# Patient Record
Sex: Female | Born: 1950 | Race: White | Hispanic: No | Marital: Married | Smoking: Never smoker
Health system: Southern US, Community
[De-identification: ages and names within clinical notes are randomized; demographics above are authoritative.]

## PROBLEM LIST (undated history)

## (undated) DIAGNOSIS — R011 Cardiac murmur, unspecified: Secondary | ICD-10-CM

## (undated) DIAGNOSIS — R51 Headache: Secondary | ICD-10-CM

## (undated) DIAGNOSIS — T4145XA Adverse effect of unspecified anesthetic, initial encounter: Secondary | ICD-10-CM

## (undated) DIAGNOSIS — R519 Headache, unspecified: Secondary | ICD-10-CM

## (undated) DIAGNOSIS — T8859XA Other complications of anesthesia, initial encounter: Secondary | ICD-10-CM

## (undated) HISTORY — PX: CHOLECYSTECTOMY: SHX55

## (undated) HISTORY — PX: ABDOMINAL HYSTERECTOMY: SHX81

---

## 2017-08-17 DIAGNOSIS — M542 Cervicalgia: Secondary | ICD-10-CM | POA: Diagnosis not present

## 2017-08-17 DIAGNOSIS — M25551 Pain in right hip: Secondary | ICD-10-CM | POA: Diagnosis not present

## 2017-08-17 DIAGNOSIS — M9903 Segmental and somatic dysfunction of lumbar region: Secondary | ICD-10-CM | POA: Diagnosis not present

## 2017-08-17 DIAGNOSIS — M9902 Segmental and somatic dysfunction of thoracic region: Secondary | ICD-10-CM | POA: Diagnosis not present

## 2017-08-17 DIAGNOSIS — M5431 Sciatica, right side: Secondary | ICD-10-CM | POA: Diagnosis not present

## 2017-08-17 DIAGNOSIS — M9901 Segmental and somatic dysfunction of cervical region: Secondary | ICD-10-CM | POA: Diagnosis not present

## 2017-08-20 DIAGNOSIS — M542 Cervicalgia: Secondary | ICD-10-CM | POA: Diagnosis not present

## 2017-08-20 DIAGNOSIS — M5431 Sciatica, right side: Secondary | ICD-10-CM | POA: Diagnosis not present

## 2017-08-20 DIAGNOSIS — M9903 Segmental and somatic dysfunction of lumbar region: Secondary | ICD-10-CM | POA: Diagnosis not present

## 2017-08-20 DIAGNOSIS — M25551 Pain in right hip: Secondary | ICD-10-CM | POA: Diagnosis not present

## 2017-08-20 DIAGNOSIS — M9902 Segmental and somatic dysfunction of thoracic region: Secondary | ICD-10-CM | POA: Diagnosis not present

## 2017-08-20 DIAGNOSIS — M9901 Segmental and somatic dysfunction of cervical region: Secondary | ICD-10-CM | POA: Diagnosis not present

## 2017-08-24 DIAGNOSIS — M542 Cervicalgia: Secondary | ICD-10-CM | POA: Diagnosis not present

## 2017-08-24 DIAGNOSIS — M25551 Pain in right hip: Secondary | ICD-10-CM | POA: Diagnosis not present

## 2017-08-24 DIAGNOSIS — M9903 Segmental and somatic dysfunction of lumbar region: Secondary | ICD-10-CM | POA: Diagnosis not present

## 2017-08-24 DIAGNOSIS — M9902 Segmental and somatic dysfunction of thoracic region: Secondary | ICD-10-CM | POA: Diagnosis not present

## 2017-08-24 DIAGNOSIS — M5431 Sciatica, right side: Secondary | ICD-10-CM | POA: Diagnosis not present

## 2017-08-24 DIAGNOSIS — M9901 Segmental and somatic dysfunction of cervical region: Secondary | ICD-10-CM | POA: Diagnosis not present

## 2017-08-27 DIAGNOSIS — M9902 Segmental and somatic dysfunction of thoracic region: Secondary | ICD-10-CM | POA: Diagnosis not present

## 2017-08-27 DIAGNOSIS — M5431 Sciatica, right side: Secondary | ICD-10-CM | POA: Diagnosis not present

## 2017-08-27 DIAGNOSIS — M25551 Pain in right hip: Secondary | ICD-10-CM | POA: Diagnosis not present

## 2017-08-27 DIAGNOSIS — M9903 Segmental and somatic dysfunction of lumbar region: Secondary | ICD-10-CM | POA: Diagnosis not present

## 2017-08-27 DIAGNOSIS — M9901 Segmental and somatic dysfunction of cervical region: Secondary | ICD-10-CM | POA: Diagnosis not present

## 2017-08-27 DIAGNOSIS — M542 Cervicalgia: Secondary | ICD-10-CM | POA: Diagnosis not present

## 2017-08-31 DIAGNOSIS — M9902 Segmental and somatic dysfunction of thoracic region: Secondary | ICD-10-CM | POA: Diagnosis not present

## 2017-08-31 DIAGNOSIS — M25551 Pain in right hip: Secondary | ICD-10-CM | POA: Diagnosis not present

## 2017-08-31 DIAGNOSIS — M9903 Segmental and somatic dysfunction of lumbar region: Secondary | ICD-10-CM | POA: Diagnosis not present

## 2017-08-31 DIAGNOSIS — M9901 Segmental and somatic dysfunction of cervical region: Secondary | ICD-10-CM | POA: Diagnosis not present

## 2017-08-31 DIAGNOSIS — M542 Cervicalgia: Secondary | ICD-10-CM | POA: Diagnosis not present

## 2017-08-31 DIAGNOSIS — M5431 Sciatica, right side: Secondary | ICD-10-CM | POA: Diagnosis not present

## 2017-09-03 DIAGNOSIS — M9903 Segmental and somatic dysfunction of lumbar region: Secondary | ICD-10-CM | POA: Diagnosis not present

## 2017-09-03 DIAGNOSIS — M25551 Pain in right hip: Secondary | ICD-10-CM | POA: Diagnosis not present

## 2017-09-03 DIAGNOSIS — M5431 Sciatica, right side: Secondary | ICD-10-CM | POA: Diagnosis not present

## 2017-09-03 DIAGNOSIS — M9902 Segmental and somatic dysfunction of thoracic region: Secondary | ICD-10-CM | POA: Diagnosis not present

## 2017-09-03 DIAGNOSIS — M542 Cervicalgia: Secondary | ICD-10-CM | POA: Diagnosis not present

## 2017-09-03 DIAGNOSIS — M9901 Segmental and somatic dysfunction of cervical region: Secondary | ICD-10-CM | POA: Diagnosis not present

## 2017-09-07 DIAGNOSIS — M9902 Segmental and somatic dysfunction of thoracic region: Secondary | ICD-10-CM | POA: Diagnosis not present

## 2017-09-07 DIAGNOSIS — M542 Cervicalgia: Secondary | ICD-10-CM | POA: Diagnosis not present

## 2017-09-07 DIAGNOSIS — M9903 Segmental and somatic dysfunction of lumbar region: Secondary | ICD-10-CM | POA: Diagnosis not present

## 2017-09-07 DIAGNOSIS — M5431 Sciatica, right side: Secondary | ICD-10-CM | POA: Diagnosis not present

## 2017-09-07 DIAGNOSIS — M9901 Segmental and somatic dysfunction of cervical region: Secondary | ICD-10-CM | POA: Diagnosis not present

## 2017-09-07 DIAGNOSIS — M25551 Pain in right hip: Secondary | ICD-10-CM | POA: Diagnosis not present

## 2017-09-14 DIAGNOSIS — M542 Cervicalgia: Secondary | ICD-10-CM | POA: Diagnosis not present

## 2017-09-14 DIAGNOSIS — M25551 Pain in right hip: Secondary | ICD-10-CM | POA: Diagnosis not present

## 2017-09-14 DIAGNOSIS — M9903 Segmental and somatic dysfunction of lumbar region: Secondary | ICD-10-CM | POA: Diagnosis not present

## 2017-09-14 DIAGNOSIS — M9902 Segmental and somatic dysfunction of thoracic region: Secondary | ICD-10-CM | POA: Diagnosis not present

## 2017-09-14 DIAGNOSIS — M5431 Sciatica, right side: Secondary | ICD-10-CM | POA: Diagnosis not present

## 2017-09-14 DIAGNOSIS — M9901 Segmental and somatic dysfunction of cervical region: Secondary | ICD-10-CM | POA: Diagnosis not present

## 2017-09-21 DIAGNOSIS — M542 Cervicalgia: Secondary | ICD-10-CM | POA: Diagnosis not present

## 2017-09-21 DIAGNOSIS — M9902 Segmental and somatic dysfunction of thoracic region: Secondary | ICD-10-CM | POA: Diagnosis not present

## 2017-09-21 DIAGNOSIS — M9903 Segmental and somatic dysfunction of lumbar region: Secondary | ICD-10-CM | POA: Diagnosis not present

## 2017-09-21 DIAGNOSIS — M5431 Sciatica, right side: Secondary | ICD-10-CM | POA: Diagnosis not present

## 2017-09-21 DIAGNOSIS — M25551 Pain in right hip: Secondary | ICD-10-CM | POA: Diagnosis not present

## 2017-09-21 DIAGNOSIS — M9901 Segmental and somatic dysfunction of cervical region: Secondary | ICD-10-CM | POA: Diagnosis not present

## 2017-10-01 DIAGNOSIS — M5431 Sciatica, right side: Secondary | ICD-10-CM | POA: Diagnosis not present

## 2017-10-01 DIAGNOSIS — M9902 Segmental and somatic dysfunction of thoracic region: Secondary | ICD-10-CM | POA: Diagnosis not present

## 2017-10-01 DIAGNOSIS — M25551 Pain in right hip: Secondary | ICD-10-CM | POA: Diagnosis not present

## 2017-10-01 DIAGNOSIS — M542 Cervicalgia: Secondary | ICD-10-CM | POA: Diagnosis not present

## 2017-10-01 DIAGNOSIS — M9901 Segmental and somatic dysfunction of cervical region: Secondary | ICD-10-CM | POA: Diagnosis not present

## 2017-10-01 DIAGNOSIS — M9903 Segmental and somatic dysfunction of lumbar region: Secondary | ICD-10-CM | POA: Diagnosis not present

## 2017-10-15 DIAGNOSIS — M9902 Segmental and somatic dysfunction of thoracic region: Secondary | ICD-10-CM | POA: Diagnosis not present

## 2017-10-15 DIAGNOSIS — M9903 Segmental and somatic dysfunction of lumbar region: Secondary | ICD-10-CM | POA: Diagnosis not present

## 2017-10-15 DIAGNOSIS — M542 Cervicalgia: Secondary | ICD-10-CM | POA: Diagnosis not present

## 2017-10-15 DIAGNOSIS — M9901 Segmental and somatic dysfunction of cervical region: Secondary | ICD-10-CM | POA: Diagnosis not present

## 2017-10-15 DIAGNOSIS — M25551 Pain in right hip: Secondary | ICD-10-CM | POA: Diagnosis not present

## 2017-10-15 DIAGNOSIS — M5431 Sciatica, right side: Secondary | ICD-10-CM | POA: Diagnosis not present

## 2017-10-29 DIAGNOSIS — M5431 Sciatica, right side: Secondary | ICD-10-CM | POA: Diagnosis not present

## 2017-10-29 DIAGNOSIS — M9901 Segmental and somatic dysfunction of cervical region: Secondary | ICD-10-CM | POA: Diagnosis not present

## 2017-10-29 DIAGNOSIS — M25551 Pain in right hip: Secondary | ICD-10-CM | POA: Diagnosis not present

## 2017-10-29 DIAGNOSIS — M9903 Segmental and somatic dysfunction of lumbar region: Secondary | ICD-10-CM | POA: Diagnosis not present

## 2017-10-29 DIAGNOSIS — M542 Cervicalgia: Secondary | ICD-10-CM | POA: Diagnosis not present

## 2017-10-29 DIAGNOSIS — M9902 Segmental and somatic dysfunction of thoracic region: Secondary | ICD-10-CM | POA: Diagnosis not present

## 2017-11-12 DIAGNOSIS — M9903 Segmental and somatic dysfunction of lumbar region: Secondary | ICD-10-CM | POA: Diagnosis not present

## 2017-11-12 DIAGNOSIS — M9901 Segmental and somatic dysfunction of cervical region: Secondary | ICD-10-CM | POA: Diagnosis not present

## 2017-11-12 DIAGNOSIS — M25551 Pain in right hip: Secondary | ICD-10-CM | POA: Diagnosis not present

## 2017-11-12 DIAGNOSIS — M9902 Segmental and somatic dysfunction of thoracic region: Secondary | ICD-10-CM | POA: Diagnosis not present

## 2017-11-12 DIAGNOSIS — M5431 Sciatica, right side: Secondary | ICD-10-CM | POA: Diagnosis not present

## 2017-11-12 DIAGNOSIS — M542 Cervicalgia: Secondary | ICD-10-CM | POA: Diagnosis not present

## 2017-11-24 DIAGNOSIS — I10 Essential (primary) hypertension: Secondary | ICD-10-CM | POA: Diagnosis not present

## 2017-11-24 DIAGNOSIS — E782 Mixed hyperlipidemia: Secondary | ICD-10-CM | POA: Diagnosis not present

## 2017-11-25 ENCOUNTER — Encounter (INDEPENDENT_AMBULATORY_CARE_PROVIDER_SITE_OTHER): Payer: Self-pay | Admitting: *Deleted

## 2017-11-26 DIAGNOSIS — M25551 Pain in right hip: Secondary | ICD-10-CM | POA: Diagnosis not present

## 2017-11-26 DIAGNOSIS — M9901 Segmental and somatic dysfunction of cervical region: Secondary | ICD-10-CM | POA: Diagnosis not present

## 2017-11-26 DIAGNOSIS — M9902 Segmental and somatic dysfunction of thoracic region: Secondary | ICD-10-CM | POA: Diagnosis not present

## 2017-11-26 DIAGNOSIS — M542 Cervicalgia: Secondary | ICD-10-CM | POA: Diagnosis not present

## 2017-11-26 DIAGNOSIS — M5431 Sciatica, right side: Secondary | ICD-10-CM | POA: Diagnosis not present

## 2017-11-26 DIAGNOSIS — M9903 Segmental and somatic dysfunction of lumbar region: Secondary | ICD-10-CM | POA: Diagnosis not present

## 2017-12-03 DIAGNOSIS — M542 Cervicalgia: Secondary | ICD-10-CM | POA: Diagnosis not present

## 2017-12-03 DIAGNOSIS — M5431 Sciatica, right side: Secondary | ICD-10-CM | POA: Diagnosis not present

## 2017-12-03 DIAGNOSIS — M9903 Segmental and somatic dysfunction of lumbar region: Secondary | ICD-10-CM | POA: Diagnosis not present

## 2017-12-03 DIAGNOSIS — M9901 Segmental and somatic dysfunction of cervical region: Secondary | ICD-10-CM | POA: Diagnosis not present

## 2017-12-03 DIAGNOSIS — M9902 Segmental and somatic dysfunction of thoracic region: Secondary | ICD-10-CM | POA: Diagnosis not present

## 2017-12-03 DIAGNOSIS — M25551 Pain in right hip: Secondary | ICD-10-CM | POA: Diagnosis not present

## 2017-12-10 ENCOUNTER — Encounter (INDEPENDENT_AMBULATORY_CARE_PROVIDER_SITE_OTHER): Payer: Self-pay | Admitting: *Deleted

## 2017-12-10 DIAGNOSIS — M9902 Segmental and somatic dysfunction of thoracic region: Secondary | ICD-10-CM | POA: Diagnosis not present

## 2017-12-10 DIAGNOSIS — Z1231 Encounter for screening mammogram for malignant neoplasm of breast: Secondary | ICD-10-CM | POA: Diagnosis not present

## 2017-12-10 DIAGNOSIS — M9901 Segmental and somatic dysfunction of cervical region: Secondary | ICD-10-CM | POA: Diagnosis not present

## 2017-12-10 DIAGNOSIS — M25551 Pain in right hip: Secondary | ICD-10-CM | POA: Diagnosis not present

## 2017-12-10 DIAGNOSIS — M542 Cervicalgia: Secondary | ICD-10-CM | POA: Diagnosis not present

## 2017-12-10 DIAGNOSIS — M9903 Segmental and somatic dysfunction of lumbar region: Secondary | ICD-10-CM | POA: Diagnosis not present

## 2017-12-10 DIAGNOSIS — M5431 Sciatica, right side: Secondary | ICD-10-CM | POA: Diagnosis not present

## 2017-12-17 ENCOUNTER — Other Ambulatory Visit (INDEPENDENT_AMBULATORY_CARE_PROVIDER_SITE_OTHER): Payer: Self-pay | Admitting: *Deleted

## 2017-12-17 DIAGNOSIS — M542 Cervicalgia: Secondary | ICD-10-CM | POA: Diagnosis not present

## 2017-12-17 DIAGNOSIS — Z1211 Encounter for screening for malignant neoplasm of colon: Secondary | ICD-10-CM

## 2017-12-17 DIAGNOSIS — M25551 Pain in right hip: Secondary | ICD-10-CM | POA: Diagnosis not present

## 2017-12-17 DIAGNOSIS — M5431 Sciatica, right side: Secondary | ICD-10-CM | POA: Diagnosis not present

## 2017-12-17 DIAGNOSIS — M9901 Segmental and somatic dysfunction of cervical region: Secondary | ICD-10-CM | POA: Diagnosis not present

## 2017-12-17 DIAGNOSIS — M9902 Segmental and somatic dysfunction of thoracic region: Secondary | ICD-10-CM | POA: Diagnosis not present

## 2017-12-17 DIAGNOSIS — M9903 Segmental and somatic dysfunction of lumbar region: Secondary | ICD-10-CM | POA: Diagnosis not present

## 2017-12-22 DIAGNOSIS — M810 Age-related osteoporosis without current pathological fracture: Secondary | ICD-10-CM | POA: Diagnosis not present

## 2017-12-22 DIAGNOSIS — M818 Other osteoporosis without current pathological fracture: Secondary | ICD-10-CM | POA: Diagnosis not present

## 2017-12-31 DIAGNOSIS — M25551 Pain in right hip: Secondary | ICD-10-CM | POA: Diagnosis not present

## 2017-12-31 DIAGNOSIS — M5431 Sciatica, right side: Secondary | ICD-10-CM | POA: Diagnosis not present

## 2017-12-31 DIAGNOSIS — M9902 Segmental and somatic dysfunction of thoracic region: Secondary | ICD-10-CM | POA: Diagnosis not present

## 2017-12-31 DIAGNOSIS — M9901 Segmental and somatic dysfunction of cervical region: Secondary | ICD-10-CM | POA: Diagnosis not present

## 2017-12-31 DIAGNOSIS — M9903 Segmental and somatic dysfunction of lumbar region: Secondary | ICD-10-CM | POA: Diagnosis not present

## 2017-12-31 DIAGNOSIS — M542 Cervicalgia: Secondary | ICD-10-CM | POA: Diagnosis not present

## 2018-01-07 DIAGNOSIS — M25551 Pain in right hip: Secondary | ICD-10-CM | POA: Diagnosis not present

## 2018-01-07 DIAGNOSIS — M9903 Segmental and somatic dysfunction of lumbar region: Secondary | ICD-10-CM | POA: Diagnosis not present

## 2018-01-07 DIAGNOSIS — M9902 Segmental and somatic dysfunction of thoracic region: Secondary | ICD-10-CM | POA: Diagnosis not present

## 2018-01-07 DIAGNOSIS — M9901 Segmental and somatic dysfunction of cervical region: Secondary | ICD-10-CM | POA: Diagnosis not present

## 2018-01-07 DIAGNOSIS — M5431 Sciatica, right side: Secondary | ICD-10-CM | POA: Diagnosis not present

## 2018-01-07 DIAGNOSIS — M542 Cervicalgia: Secondary | ICD-10-CM | POA: Diagnosis not present

## 2018-01-14 DIAGNOSIS — M542 Cervicalgia: Secondary | ICD-10-CM | POA: Diagnosis not present

## 2018-01-14 DIAGNOSIS — M9901 Segmental and somatic dysfunction of cervical region: Secondary | ICD-10-CM | POA: Diagnosis not present

## 2018-01-14 DIAGNOSIS — M25551 Pain in right hip: Secondary | ICD-10-CM | POA: Diagnosis not present

## 2018-01-14 DIAGNOSIS — M5431 Sciatica, right side: Secondary | ICD-10-CM | POA: Diagnosis not present

## 2018-01-14 DIAGNOSIS — M9903 Segmental and somatic dysfunction of lumbar region: Secondary | ICD-10-CM | POA: Diagnosis not present

## 2018-01-14 DIAGNOSIS — M9902 Segmental and somatic dysfunction of thoracic region: Secondary | ICD-10-CM | POA: Diagnosis not present

## 2018-01-28 DIAGNOSIS — M5431 Sciatica, right side: Secondary | ICD-10-CM | POA: Diagnosis not present

## 2018-01-28 DIAGNOSIS — M9902 Segmental and somatic dysfunction of thoracic region: Secondary | ICD-10-CM | POA: Diagnosis not present

## 2018-01-28 DIAGNOSIS — M9901 Segmental and somatic dysfunction of cervical region: Secondary | ICD-10-CM | POA: Diagnosis not present

## 2018-01-28 DIAGNOSIS — M25551 Pain in right hip: Secondary | ICD-10-CM | POA: Diagnosis not present

## 2018-01-28 DIAGNOSIS — M9903 Segmental and somatic dysfunction of lumbar region: Secondary | ICD-10-CM | POA: Diagnosis not present

## 2018-01-28 DIAGNOSIS — M542 Cervicalgia: Secondary | ICD-10-CM | POA: Diagnosis not present

## 2018-02-04 DIAGNOSIS — M9901 Segmental and somatic dysfunction of cervical region: Secondary | ICD-10-CM | POA: Diagnosis not present

## 2018-02-04 DIAGNOSIS — M542 Cervicalgia: Secondary | ICD-10-CM | POA: Diagnosis not present

## 2018-02-04 DIAGNOSIS — M25551 Pain in right hip: Secondary | ICD-10-CM | POA: Diagnosis not present

## 2018-02-04 DIAGNOSIS — M9903 Segmental and somatic dysfunction of lumbar region: Secondary | ICD-10-CM | POA: Diagnosis not present

## 2018-02-04 DIAGNOSIS — M5431 Sciatica, right side: Secondary | ICD-10-CM | POA: Diagnosis not present

## 2018-02-04 DIAGNOSIS — M9902 Segmental and somatic dysfunction of thoracic region: Secondary | ICD-10-CM | POA: Diagnosis not present

## 2018-02-18 DIAGNOSIS — M542 Cervicalgia: Secondary | ICD-10-CM | POA: Diagnosis not present

## 2018-02-18 DIAGNOSIS — M5431 Sciatica, right side: Secondary | ICD-10-CM | POA: Diagnosis not present

## 2018-02-18 DIAGNOSIS — M9903 Segmental and somatic dysfunction of lumbar region: Secondary | ICD-10-CM | POA: Diagnosis not present

## 2018-02-18 DIAGNOSIS — M9902 Segmental and somatic dysfunction of thoracic region: Secondary | ICD-10-CM | POA: Diagnosis not present

## 2018-02-18 DIAGNOSIS — M9901 Segmental and somatic dysfunction of cervical region: Secondary | ICD-10-CM | POA: Diagnosis not present

## 2018-02-18 DIAGNOSIS — M25551 Pain in right hip: Secondary | ICD-10-CM | POA: Diagnosis not present

## 2018-03-01 ENCOUNTER — Encounter (INDEPENDENT_AMBULATORY_CARE_PROVIDER_SITE_OTHER): Payer: Self-pay | Admitting: *Deleted

## 2018-03-01 ENCOUNTER — Telehealth (INDEPENDENT_AMBULATORY_CARE_PROVIDER_SITE_OTHER): Payer: Self-pay | Admitting: *Deleted

## 2018-03-01 NOTE — Telephone Encounter (Signed)
Patient needs suprep 

## 2018-03-04 DIAGNOSIS — M542 Cervicalgia: Secondary | ICD-10-CM | POA: Diagnosis not present

## 2018-03-04 DIAGNOSIS — M25551 Pain in right hip: Secondary | ICD-10-CM | POA: Diagnosis not present

## 2018-03-04 DIAGNOSIS — M9902 Segmental and somatic dysfunction of thoracic region: Secondary | ICD-10-CM | POA: Diagnosis not present

## 2018-03-04 DIAGNOSIS — M5431 Sciatica, right side: Secondary | ICD-10-CM | POA: Diagnosis not present

## 2018-03-04 DIAGNOSIS — M9901 Segmental and somatic dysfunction of cervical region: Secondary | ICD-10-CM | POA: Diagnosis not present

## 2018-03-04 DIAGNOSIS — M9903 Segmental and somatic dysfunction of lumbar region: Secondary | ICD-10-CM | POA: Diagnosis not present

## 2018-03-04 MED ORDER — SUPREP BOWEL PREP KIT 17.5-3.13-1.6 GM/177ML PO SOLN: 1.0000 | Freq: Once | ORAL | 0 refills | Status: AC

## 2018-03-11 DIAGNOSIS — M9902 Segmental and somatic dysfunction of thoracic region: Secondary | ICD-10-CM | POA: Diagnosis not present

## 2018-03-11 DIAGNOSIS — M9901 Segmental and somatic dysfunction of cervical region: Secondary | ICD-10-CM | POA: Diagnosis not present

## 2018-03-11 DIAGNOSIS — M9903 Segmental and somatic dysfunction of lumbar region: Secondary | ICD-10-CM | POA: Diagnosis not present

## 2018-03-11 DIAGNOSIS — M5431 Sciatica, right side: Secondary | ICD-10-CM | POA: Diagnosis not present

## 2018-03-11 DIAGNOSIS — M542 Cervicalgia: Secondary | ICD-10-CM | POA: Diagnosis not present

## 2018-03-11 DIAGNOSIS — M25551 Pain in right hip: Secondary | ICD-10-CM | POA: Diagnosis not present

## 2018-03-15 DIAGNOSIS — M9903 Segmental and somatic dysfunction of lumbar region: Secondary | ICD-10-CM | POA: Diagnosis not present

## 2018-03-15 DIAGNOSIS — M542 Cervicalgia: Secondary | ICD-10-CM | POA: Diagnosis not present

## 2018-03-15 DIAGNOSIS — M5431 Sciatica, right side: Secondary | ICD-10-CM | POA: Diagnosis not present

## 2018-03-15 DIAGNOSIS — M25551 Pain in right hip: Secondary | ICD-10-CM | POA: Diagnosis not present

## 2018-03-15 DIAGNOSIS — M9902 Segmental and somatic dysfunction of thoracic region: Secondary | ICD-10-CM | POA: Diagnosis not present

## 2018-03-15 DIAGNOSIS — M9901 Segmental and somatic dysfunction of cervical region: Secondary | ICD-10-CM | POA: Diagnosis not present

## 2018-03-17 ENCOUNTER — Telehealth (INDEPENDENT_AMBULATORY_CARE_PROVIDER_SITE_OTHER): Payer: Self-pay | Admitting: *Deleted

## 2018-03-17 NOTE — Telephone Encounter (Signed)
Referring MD/PCP: hasanaj   Procedure: tcs  Reason/Indication:  screening  Has patient had this procedure before?  no  If so, when, by whom and where?    Is there a family history of colon cancer?  no  Who?  What age when diagnosed?    Is patient diabetic?   no      Does patient have prosthetic heart valve or mechanical valve?  no  Do you have a pacemaker?  no  Has patient ever had endocarditis? no  Has patient had joint replacement within last 12 months?  no  Is patient constipated or do they take laxatives? no  Does patient have a history of alcohol/drug use?  no  Is patient on blood thinner such as Coumadin, Plavix and/or Aspirin? no  Medications: amlodipine 5 mg daily  Allergies: sulfur  Medication Adjustment per Dr Lindi Adie, NP:   Procedure date & time: 04/14/18 at 730

## 2018-03-17 NOTE — Telephone Encounter (Signed)
agree

## 2018-03-18 DIAGNOSIS — M5431 Sciatica, right side: Secondary | ICD-10-CM | POA: Diagnosis not present

## 2018-03-18 DIAGNOSIS — M542 Cervicalgia: Secondary | ICD-10-CM | POA: Diagnosis not present

## 2018-03-18 DIAGNOSIS — M9902 Segmental and somatic dysfunction of thoracic region: Secondary | ICD-10-CM | POA: Diagnosis not present

## 2018-03-18 DIAGNOSIS — M9903 Segmental and somatic dysfunction of lumbar region: Secondary | ICD-10-CM | POA: Diagnosis not present

## 2018-03-18 DIAGNOSIS — M9901 Segmental and somatic dysfunction of cervical region: Secondary | ICD-10-CM | POA: Diagnosis not present

## 2018-03-18 DIAGNOSIS — M25551 Pain in right hip: Secondary | ICD-10-CM | POA: Diagnosis not present

## 2018-03-25 DIAGNOSIS — M9902 Segmental and somatic dysfunction of thoracic region: Secondary | ICD-10-CM | POA: Diagnosis not present

## 2018-03-25 DIAGNOSIS — M9901 Segmental and somatic dysfunction of cervical region: Secondary | ICD-10-CM | POA: Diagnosis not present

## 2018-03-25 DIAGNOSIS — M9903 Segmental and somatic dysfunction of lumbar region: Secondary | ICD-10-CM | POA: Diagnosis not present

## 2018-03-25 DIAGNOSIS — M542 Cervicalgia: Secondary | ICD-10-CM | POA: Diagnosis not present

## 2018-03-25 DIAGNOSIS — M25551 Pain in right hip: Secondary | ICD-10-CM | POA: Diagnosis not present

## 2018-03-25 DIAGNOSIS — M5431 Sciatica, right side: Secondary | ICD-10-CM | POA: Diagnosis not present

## 2018-04-01 DIAGNOSIS — M542 Cervicalgia: Secondary | ICD-10-CM | POA: Diagnosis not present

## 2018-04-01 DIAGNOSIS — M9903 Segmental and somatic dysfunction of lumbar region: Secondary | ICD-10-CM | POA: Diagnosis not present

## 2018-04-01 DIAGNOSIS — M9902 Segmental and somatic dysfunction of thoracic region: Secondary | ICD-10-CM | POA: Diagnosis not present

## 2018-04-01 DIAGNOSIS — M5431 Sciatica, right side: Secondary | ICD-10-CM | POA: Diagnosis not present

## 2018-04-01 DIAGNOSIS — M25551 Pain in right hip: Secondary | ICD-10-CM | POA: Diagnosis not present

## 2018-04-01 DIAGNOSIS — M9901 Segmental and somatic dysfunction of cervical region: Secondary | ICD-10-CM | POA: Diagnosis not present

## 2018-04-08 DIAGNOSIS — M9901 Segmental and somatic dysfunction of cervical region: Secondary | ICD-10-CM | POA: Diagnosis not present

## 2018-04-08 DIAGNOSIS — M5431 Sciatica, right side: Secondary | ICD-10-CM | POA: Diagnosis not present

## 2018-04-08 DIAGNOSIS — M25551 Pain in right hip: Secondary | ICD-10-CM | POA: Diagnosis not present

## 2018-04-08 DIAGNOSIS — M9903 Segmental and somatic dysfunction of lumbar region: Secondary | ICD-10-CM | POA: Diagnosis not present

## 2018-04-08 DIAGNOSIS — M9902 Segmental and somatic dysfunction of thoracic region: Secondary | ICD-10-CM | POA: Diagnosis not present

## 2018-04-08 DIAGNOSIS — M542 Cervicalgia: Secondary | ICD-10-CM | POA: Diagnosis not present

## 2018-04-14 ENCOUNTER — Ambulatory Visit (HOSPITAL_COMMUNITY)
Admission: RE | Admit: 2018-04-14 | Discharge: 2018-04-14 | Disposition: A | Discharge status: Home / Self Care | Payer: Medicare Other | Attending: Internal Medicine | Admitting: Internal Medicine

## 2018-04-14 ENCOUNTER — Other Ambulatory Visit: Payer: Self-pay

## 2018-04-14 ENCOUNTER — Encounter (HOSPITAL_COMMUNITY)
Admission: RE | Disposition: A | Discharge status: Home / Self Care | Payer: Self-pay | Source: Home / Self Care | Attending: Internal Medicine

## 2018-04-14 ENCOUNTER — Encounter (HOSPITAL_COMMUNITY): Payer: Self-pay | Admitting: *Deleted

## 2018-04-14 DIAGNOSIS — Z1211 Encounter for screening for malignant neoplasm of colon: Secondary | ICD-10-CM | POA: Insufficient documentation

## 2018-04-14 DIAGNOSIS — Z79899 Other long term (current) drug therapy: Secondary | ICD-10-CM | POA: Diagnosis not present

## 2018-04-14 DIAGNOSIS — Q438 Other specified congenital malformations of intestine: Secondary | ICD-10-CM | POA: Diagnosis not present

## 2018-04-14 DIAGNOSIS — K644 Residual hemorrhoidal skin tags: Secondary | ICD-10-CM | POA: Diagnosis not present

## 2018-04-14 DIAGNOSIS — K5730 Diverticulosis of large intestine without perforation or abscess without bleeding: Secondary | ICD-10-CM | POA: Diagnosis not present

## 2018-04-14 HISTORY — DX: Cardiac murmur, unspecified: R01.1

## 2018-04-14 HISTORY — DX: Headache: R51

## 2018-04-14 HISTORY — DX: Adverse effect of unspecified anesthetic, initial encounter: T41.45XA

## 2018-04-14 HISTORY — PX: COLONOSCOPY: SHX5424

## 2018-04-14 HISTORY — DX: Headache, unspecified: R51.9

## 2018-04-14 HISTORY — DX: Other complications of anesthesia, initial encounter: T88.59XA

## 2018-04-14 SURGERY — COLONOSCOPY
Anesthesia: Moderate Sedation

## 2018-04-14 MED ORDER — MEPERIDINE HCL 50 MG/ML IJ SOLN
INTRAMUSCULAR | Status: DC | PRN
  Administered 2018-04-14 (×2): 25 mg via INTRAVENOUS

## 2018-04-14 MED ORDER — ONDANSETRON HCL 4 MG/2ML IJ SOLN
INTRAMUSCULAR | Status: AC
  Filled 2018-04-14: qty 2

## 2018-04-14 MED ORDER — LIDOCAINE HCL URETHRAL/MUCOSAL 2 % EX GEL
CUTANEOUS | Status: AC
  Filled 2018-04-14: qty 30

## 2018-04-14 MED ORDER — SODIUM CHLORIDE 0.9 % IV SOLN
INTRAVENOUS | Status: DC
  Administered 2018-04-14: 1000 mL via INTRAVENOUS

## 2018-04-14 MED ORDER — MIDAZOLAM HCL 5 MG/5ML IJ SOLN
INTRAMUSCULAR | Status: AC
  Filled 2018-04-14: qty 10

## 2018-04-14 MED ORDER — MEPERIDINE HCL 50 MG/ML IJ SOLN
INTRAMUSCULAR | Status: AC
  Filled 2018-04-14: qty 1

## 2018-04-14 MED ORDER — STERILE WATER FOR IRRIGATION IR SOLN
Status: DC | PRN
  Administered 2018-04-14: 08:00:00

## 2018-04-14 MED ORDER — MIDAZOLAM HCL 5 MG/5ML IJ SOLN
INTRAMUSCULAR | Status: DC | PRN
  Administered 2018-04-14: 2 mg via INTRAVENOUS
  Administered 2018-04-14: 1 mg via INTRAVENOUS
  Administered 2018-04-14: 2 mg via INTRAVENOUS

## 2018-04-14 MED ORDER — ONDANSETRON HCL 4 MG/2ML IJ SOLN
INTRAMUSCULAR | Status: DC | PRN
  Administered 2018-04-14: 4 mg via INTRAVENOUS

## 2018-04-14 NOTE — Op Note (Signed)
[facility] Patient Name: Tricia Mendoza Procedure Date: 04/14/2018 7:11 AM MRN: [id number] Date of Birth: Oct 06, 1950 Attending MD: Hildred Laser , MD CSN: [id number] Age: 68 Admit Type: Outpatient Procedure:                Colonoscopy Indications:              Screening for colorectal malignant neoplasm Providers:                Hildred Laser, MD, Otis Peak B. Sharon Seller, RN, Aram Candela Referring MD:             Stoney Bang, MD Medicines:                Meperidine 50 mg IV, Midazolam 5 mg IV Complications:            No immediate complications. Estimated Blood Loss:     Estimated blood loss: none. Procedure:                Pre-Anesthesia Assessment:                           - Prior to the procedure, a History and Physical                            was performed, and patient medications and                            allergies were reviewed. The patient's tolerance of                            previous anesthesia was also reviewed. The risks                            and benefits of the procedure and the sedation                            options and risks were discussed with the patient.                            All questions were answered, and informed consent                            was obtained. Prior Anticoagulants: The patient has                            taken no previous anticoagulant or antiplatelet                            agents. ASA Grade Assessment: I - A normal, healthy                            patient. After reviewing the risks and benefits,  the patient was deemed in satisfactory condition to                            undergo the procedure.                           After obtaining informed consent, the colonoscope                            was passed under direct vision. Throughout the                            procedure, the patient's blood pressure, pulse, and                            oxygen  saturations were monitored continuously. The                            [id number]-[id number] ([id number]) scope was introduced through                            the anus and advanced to the the cecum, identified                            by appendiceal orifice and ileocecal valve. The                            colonoscopy was somewhat difficult due to a                            redundant colon. Successful completion of the                            procedure was aided by using manual pressure. The                            patient tolerated the procedure well. The quality                            of the bowel preparation was excellent. The                            ileocecal valve, appendiceal orifice, and rectum                            were photographed. Scope In: 7:42:48 AM Scope Out: 8:01:39 AM Scope Withdrawal Time: 0 hours 8 minutes 43 seconds  Total Procedure Duration: 0 hours 18 minutes 51 seconds  Findings:      Skin tags were found on perianal exam.      Multiple small and large-mouthed diverticula were found in the sigmoid       colon.      The exam was otherwise normal throughout the examined colon.      The retroflexed view of the distal rectum and anal verge was  normal and       showed no anal or rectal abnormalities. Impression:               - Perianal skin tags found on perianal exam.                           - Diverticulosis in the sigmoid colon.                           - No specimens collected. Moderate Sedation:      Moderate (conscious) sedation was administered by the endoscopy nurse       and supervised by the endoscopist. The following parameters were       monitored: oxygen saturation, heart rate, blood pressure, CO2       capnography and response to care. Total physician intraservice time was       24 minutes. Recommendation:           - Patient has a contact number available for                            emergencies. The signs and symptoms of potential                             delayed complications were discussed with the                            patient. Return to normal activities tomorrow.                            Written discharge instructions were provided to the                            patient.                           - High fiber diet today.                           - Continue present medications.                           - Repeat colonoscopy in 10 years for screening                            purposes. Procedure Code(s):        --- Professional ---                           [phone], Colonoscopy, flexible; diagnostic, including                            collection of specimen(s) by brushing or washing,                            when performed (separate procedure)  19622, Moderate sedation; each additional 15                            minutes intraservice time                           G0500, Moderate sedation services provided by the                            same physician or other qualified health care                            professional performing a gastrointestinal                            endoscopic service that sedation supports,                            requiring the presence of an independent trained                            observer to assist in the monitoring of the                            patient's level of consciousness and physiological                            status; initial 15 minutes of intra-service time;                            patient age 16 years or older (additional time may                            be reported with [phone], as appropriate) Diagnosis Code(s):        --- Professional ---                           Z12.11, Encounter for screening for malignant                            neoplasm of colon                           K64.4, Residual hemorrhoidal skin tags                           K57.30, Diverticulosis of large intestine without                             perforation or abscess without bleeding CPT copyright 2018 [employer]. All rights reserved. The codes documented in this report are preliminary and upon coder review may  be revised to meet current compliance requirements. Hildred Laser, MD Hildred Laser, MD 04/14/2018 8:11:01 AM This report has been signed electronically. Number of Addenda: 0

## 2018-04-14 NOTE — H&P (Signed)
Tricia Mendoza is an 68 y.o. female.   Chief Complaint: Patient is here for colonoscopy. HPI: Patient 68 year old Caucasian female who is here for screening colonoscopy.  This is patient's first exam.  She denies abdominal pain change in bowel habits or rectal bleeding. Family history is negative for CRC.  Past Medical History:  Diagnosis Date  . Complication of anesthesia    nausea  . Headache   .      Past Surgical History:  Procedure Laterality Date  . ABDOMINAL HYSTERECTOMY     partial  . CHOLECYSTECTOMY      History reviewed. No pertinent family history. Social History:  reports that she has never smoked. She has never used smokeless tobacco. She reports that she does not drink alcohol or use drugs.  Allergies:  Allergies  Allergen Reactions  . Sulfa Antibiotics Hives and Itching    Medications Prior to Admission  Medication Sig Dispense Refill  . amLODipine (NORVASC) 5 MG tablet Take 5 mg by mouth daily.    . Magnesium Oxide (MAG-OX PO) Take 450 mg by mouth 2 (two) times daily.    . Coenzyme Q10 (COQ10) 100 MG CAPS Take 100 mg by mouth daily.      No results found for this or any previous visit (from the past 48 hour(s)). No results found.  ROS  Blood pressure (!) 151/74, pulse 81, temperature 97.6 F (36.4 C), temperature source Oral, resp. rate 16, height 5\' 1"  (1.549 m), weight 49.9 kg, SpO2 100 %. Physical Exam  Constitutional:  Well-developed thin Caucasian female in NAD.  HENT:  Mouth/Throat: Oropharynx is clear and moist.  Eyes: Conjunctivae are normal. No scleral icterus.  Neck: No thyromegaly present.  Cardiovascular: Normal rate, regular rhythm and normal heart sounds.  No murmur heard. Respiratory: Effort normal and breath sounds normal.  GI:  Abdomen is flat.  Laparoscopy scars noted in upper abdomen.  Abdomen is soft and nontender with organomegaly or masses.  Musculoskeletal:        General: No edema.  Lymphadenopathy:    She has no cervical  adenopathy.  Neurological: She is alert.  Skin: Skin is warm and dry.     Assessment/Plan Average her screening colonoscopy.  Hildred Laser, MD 04/14/2018, 7:32 AM

## 2018-04-14 NOTE — Discharge Instructions (Signed)
Colonoscopy, Adult, Care After This sheet gives you information about how to care for yourself after your procedure. Your health care provider may also give you more specific instructions. If you have problems or questions, contact your health care provider. What can I expect after the procedure? After the procedure, it is common to have:  A small amount of blood in your stool for 24 hours after the procedure.  Some gas.  Mild abdominal cramping or bloating. Follow these instructions at home: General instructions  For the first 24 hours after the procedure: ? Do not drive or use machinery. ? Do not sign important documents. ? Do not drink alcohol. ? Do your regular daily activities at a slower pace than normal. ? Eat soft, easy-to-digest foods.  Take over-the-counter or prescription medicines only as told by your health care provider. Relieving cramping and bloating   Try walking around when you have cramps or feel bloated.  Apply heat to your abdomen as told by your health care provider. Use a heat source that your health care provider recommends, such as a moist heat pack or a heating pad. ? Place a towel between your skin and the heat source. ? Leave the heat on for 20-30 minutes. ? Remove the heat if your skin turns bright red. This is especially important if you are unable to feel pain, heat, or cold. You may have a greater risk of getting burned. Eating and drinking   Drink enough fluid to keep your urine pale yellow.  Resume your normal diet as instructed by your health care provider. Avoid heavy or fried foods that are hard to digest.  Avoid drinking alcohol for as long as instructed by your health care provider. Contact a health care provider if:  You have blood in your stool 2-3 days after the procedure. Get help right away if:  You have more than a small spotting of blood in your stool.  You pass large blood clots in your stool.  Your abdomen is  swollen.  You have nausea or vomiting.  You have a fever.  You have increasing abdominal pain that is not relieved with medicine. Summary  After the procedure, it is common to have a small amount of blood in your stool. You may also have mild abdominal cramping and bloating.  For the first 24 hours after the procedure, do not drive or use machinery, sign important documents, or drink alcohol.  Contact your health care provider if you have a lot of blood in your stool, nausea or vomiting, a fever, or increased abdominal pain. This information is not intended to replace advice given to you by your health care provider. Make sure you discuss any questions you have with your health care provider. Document Released: 08/28/2003 Document Revised: 11/05/2016 Document Reviewed: 03/27/2015 Elsevier Interactive Patient Education  2019 [employer]  Diverticulosis  Diverticulosis is a condition that develops when small pouches (diverticula) form in the wall of the large intestine (colon). The colon is where water is absorbed and stool is formed. The pouches form when the inside layer of the colon pushes through weak spots in the outer layers of the colon. You may have a few pouches or many of them. What are the causes? The cause of this condition is not known. What increases the risk? The following factors may make you more likely to develop this condition:  Being older than age 66. Your risk for this condition increases with age. Diverticulosis is rare among people  younger than age 66. By age 66, many people have it.  Eating a low-fiber diet.  Having frequent constipation.  Being overweight.  Not getting enough exercise.  Smoking.  Taking over-the-counter pain medicines, like aspirin and ibuprofen.  Having a family history of diverticulosis. What are the signs or symptoms? In most people, there are no symptoms of this condition. If you do have symptoms, they may  include:  Bloating.  Cramps in the abdomen.  Constipation or diarrhea.  Pain in the lower left side of the abdomen. How is this diagnosed? This condition is most often diagnosed during an exam for other colon problems. Because diverticulosis usually has no symptoms, it often cannot be diagnosed independently. This condition may be diagnosed by:  Using a flexible scope to examine the colon (colonoscopy).  Taking an X-ray of the colon after dye has been put into the colon (barium enema).  Doing a CT scan. How is this treated? You may not need treatment for this condition if you have never developed an infection related to diverticulosis. If you have had an infection before, treatment may include:  Eating a high-fiber diet. This may include eating more fruits, vegetables, and grains.  Taking a fiber supplement.  Taking a live bacteria supplement (probiotic).  Taking medicine to relax your colon.  Taking antibiotic medicines. Follow these instructions at home:  Drink 6-8 glasses of water or more each day to prevent constipation.  Try not to strain when you have a bowel movement.  If you have had an infection before: ? Eat more fiber as directed by your health care provider or your diet and nutrition specialist (dietitian). ? Take a fiber supplement or probiotic, if your health care provider approves.  Take over-the-counter and prescription medicines only as told by your health care provider.  If you were prescribed an antibiotic, take it as told by your health care provider. Do not stop taking the antibiotic even if you start to feel better.  Keep all follow-up visits as told by your health care provider. This is important. Contact a health care provider if:  You have pain in your abdomen.  You have bloating.  You have cramps.  You have not had a bowel movement in 3 days. Get help right away if:  Your pain gets worse.  Your bloating becomes very bad.  You have a  fever or chills, and your symptoms suddenly get worse.  You vomit.  You have bowel movements that are bloody or black.  You have bleeding from your rectum. Summary  Diverticulosis is a condition that develops when small pouches (diverticula) form in the wall of the large intestine (colon).  You may have a few pouches or many of them.  This condition is most often diagnosed during an exam for other colon problems.  If you have had an infection related to diverticulosis, treatment may include increasing the fiber in your diet, taking supplements, or taking medicines. This information is not intended to replace advice given to you by your health care provider. Make sure you discuss any questions you have with your health care provider. Document Released: 10/11/2003 Document Revised: 12/03/2015 Document Reviewed: 12/03/2015 Elsevier Interactive Patient Education  2019 [place] usual medications as before. High-fiber diet. No driving for 24 hours. Next screening exam in 10 years.

## 2018-04-16 ENCOUNTER — Encounter (HOSPITAL_COMMUNITY): Payer: Self-pay | Admitting: Internal Medicine

## 2018-04-22 DIAGNOSIS — M9903 Segmental and somatic dysfunction of lumbar region: Secondary | ICD-10-CM | POA: Diagnosis not present

## 2018-04-22 DIAGNOSIS — M9902 Segmental and somatic dysfunction of thoracic region: Secondary | ICD-10-CM | POA: Diagnosis not present

## 2018-04-22 DIAGNOSIS — M9901 Segmental and somatic dysfunction of cervical region: Secondary | ICD-10-CM | POA: Diagnosis not present

## 2018-04-22 DIAGNOSIS — M5431 Sciatica, right side: Secondary | ICD-10-CM | POA: Diagnosis not present

## 2018-04-22 DIAGNOSIS — M25551 Pain in right hip: Secondary | ICD-10-CM | POA: Diagnosis not present

## 2018-04-22 DIAGNOSIS — M542 Cervicalgia: Secondary | ICD-10-CM | POA: Diagnosis not present

## 2018-04-29 DIAGNOSIS — M9901 Segmental and somatic dysfunction of cervical region: Secondary | ICD-10-CM | POA: Diagnosis not present

## 2018-04-29 DIAGNOSIS — M542 Cervicalgia: Secondary | ICD-10-CM | POA: Diagnosis not present

## 2018-04-29 DIAGNOSIS — M9902 Segmental and somatic dysfunction of thoracic region: Secondary | ICD-10-CM | POA: Diagnosis not present

## 2018-04-29 DIAGNOSIS — M25551 Pain in right hip: Secondary | ICD-10-CM | POA: Diagnosis not present

## 2018-04-29 DIAGNOSIS — M5431 Sciatica, right side: Secondary | ICD-10-CM | POA: Diagnosis not present

## 2018-04-29 DIAGNOSIS — M9903 Segmental and somatic dysfunction of lumbar region: Secondary | ICD-10-CM | POA: Diagnosis not present

## 2018-05-13 DIAGNOSIS — M9903 Segmental and somatic dysfunction of lumbar region: Secondary | ICD-10-CM | POA: Diagnosis not present

## 2018-05-13 DIAGNOSIS — M9902 Segmental and somatic dysfunction of thoracic region: Secondary | ICD-10-CM | POA: Diagnosis not present

## 2018-05-13 DIAGNOSIS — M25551 Pain in right hip: Secondary | ICD-10-CM | POA: Diagnosis not present

## 2018-05-13 DIAGNOSIS — M9901 Segmental and somatic dysfunction of cervical region: Secondary | ICD-10-CM | POA: Diagnosis not present

## 2018-05-13 DIAGNOSIS — M542 Cervicalgia: Secondary | ICD-10-CM | POA: Diagnosis not present

## 2018-05-13 DIAGNOSIS — M5431 Sciatica, right side: Secondary | ICD-10-CM | POA: Diagnosis not present

## 2018-05-20 DIAGNOSIS — M542 Cervicalgia: Secondary | ICD-10-CM | POA: Diagnosis not present

## 2018-05-20 DIAGNOSIS — M25551 Pain in right hip: Secondary | ICD-10-CM | POA: Diagnosis not present

## 2018-05-20 DIAGNOSIS — M9901 Segmental and somatic dysfunction of cervical region: Secondary | ICD-10-CM | POA: Diagnosis not present

## 2018-05-20 DIAGNOSIS — M9902 Segmental and somatic dysfunction of thoracic region: Secondary | ICD-10-CM | POA: Diagnosis not present

## 2018-05-20 DIAGNOSIS — M5431 Sciatica, right side: Secondary | ICD-10-CM | POA: Diagnosis not present

## 2018-05-20 DIAGNOSIS — M9903 Segmental and somatic dysfunction of lumbar region: Secondary | ICD-10-CM | POA: Diagnosis not present

## 2018-05-26 DIAGNOSIS — M818 Other osteoporosis without current pathological fracture: Secondary | ICD-10-CM | POA: Diagnosis not present

## 2018-05-26 DIAGNOSIS — E782 Mixed hyperlipidemia: Secondary | ICD-10-CM | POA: Diagnosis not present

## 2018-05-26 DIAGNOSIS — Z0000 Encounter for general adult medical examination without abnormal findings: Secondary | ICD-10-CM | POA: Diagnosis not present

## 2018-05-26 DIAGNOSIS — I10 Essential (primary) hypertension: Secondary | ICD-10-CM | POA: Diagnosis not present

## 2018-05-27 DIAGNOSIS — M542 Cervicalgia: Secondary | ICD-10-CM | POA: Diagnosis not present

## 2018-05-27 DIAGNOSIS — M9901 Segmental and somatic dysfunction of cervical region: Secondary | ICD-10-CM | POA: Diagnosis not present

## 2018-05-27 DIAGNOSIS — M5431 Sciatica, right side: Secondary | ICD-10-CM | POA: Diagnosis not present

## 2018-05-27 DIAGNOSIS — M25551 Pain in right hip: Secondary | ICD-10-CM | POA: Diagnosis not present

## 2018-05-27 DIAGNOSIS — M9902 Segmental and somatic dysfunction of thoracic region: Secondary | ICD-10-CM | POA: Diagnosis not present

## 2018-05-27 DIAGNOSIS — M9903 Segmental and somatic dysfunction of lumbar region: Secondary | ICD-10-CM | POA: Diagnosis not present

## 2018-06-03 DIAGNOSIS — M5431 Sciatica, right side: Secondary | ICD-10-CM | POA: Diagnosis not present

## 2018-06-03 DIAGNOSIS — M9902 Segmental and somatic dysfunction of thoracic region: Secondary | ICD-10-CM | POA: Diagnosis not present

## 2018-06-03 DIAGNOSIS — M542 Cervicalgia: Secondary | ICD-10-CM | POA: Diagnosis not present

## 2018-06-03 DIAGNOSIS — M9901 Segmental and somatic dysfunction of cervical region: Secondary | ICD-10-CM | POA: Diagnosis not present

## 2018-06-03 DIAGNOSIS — M25551 Pain in right hip: Secondary | ICD-10-CM | POA: Diagnosis not present

## 2018-06-03 DIAGNOSIS — M9903 Segmental and somatic dysfunction of lumbar region: Secondary | ICD-10-CM | POA: Diagnosis not present

## 2018-06-17 DIAGNOSIS — M9903 Segmental and somatic dysfunction of lumbar region: Secondary | ICD-10-CM | POA: Diagnosis not present

## 2018-06-17 DIAGNOSIS — M9901 Segmental and somatic dysfunction of cervical region: Secondary | ICD-10-CM | POA: Diagnosis not present

## 2018-06-17 DIAGNOSIS — M542 Cervicalgia: Secondary | ICD-10-CM | POA: Diagnosis not present

## 2018-06-17 DIAGNOSIS — M25551 Pain in right hip: Secondary | ICD-10-CM | POA: Diagnosis not present

## 2018-06-17 DIAGNOSIS — M9902 Segmental and somatic dysfunction of thoracic region: Secondary | ICD-10-CM | POA: Diagnosis not present

## 2018-06-17 DIAGNOSIS — M5431 Sciatica, right side: Secondary | ICD-10-CM | POA: Diagnosis not present

## 2018-06-24 DIAGNOSIS — M9902 Segmental and somatic dysfunction of thoracic region: Secondary | ICD-10-CM | POA: Diagnosis not present

## 2018-06-24 DIAGNOSIS — M9901 Segmental and somatic dysfunction of cervical region: Secondary | ICD-10-CM | POA: Diagnosis not present

## 2018-06-24 DIAGNOSIS — M542 Cervicalgia: Secondary | ICD-10-CM | POA: Diagnosis not present

## 2018-06-24 DIAGNOSIS — M9903 Segmental and somatic dysfunction of lumbar region: Secondary | ICD-10-CM | POA: Diagnosis not present

## 2018-06-24 DIAGNOSIS — M25551 Pain in right hip: Secondary | ICD-10-CM | POA: Diagnosis not present

## 2018-06-24 DIAGNOSIS — M5431 Sciatica, right side: Secondary | ICD-10-CM | POA: Diagnosis not present

## 2018-07-08 DIAGNOSIS — M9903 Segmental and somatic dysfunction of lumbar region: Secondary | ICD-10-CM | POA: Diagnosis not present

## 2018-07-08 DIAGNOSIS — M9902 Segmental and somatic dysfunction of thoracic region: Secondary | ICD-10-CM | POA: Diagnosis not present

## 2018-07-08 DIAGNOSIS — M5431 Sciatica, right side: Secondary | ICD-10-CM | POA: Diagnosis not present

## 2018-07-08 DIAGNOSIS — M542 Cervicalgia: Secondary | ICD-10-CM | POA: Diagnosis not present

## 2018-07-08 DIAGNOSIS — M9901 Segmental and somatic dysfunction of cervical region: Secondary | ICD-10-CM | POA: Diagnosis not present

## 2018-07-08 DIAGNOSIS — M25551 Pain in right hip: Secondary | ICD-10-CM | POA: Diagnosis not present

## 2018-07-15 DIAGNOSIS — M5431 Sciatica, right side: Secondary | ICD-10-CM | POA: Diagnosis not present

## 2018-07-15 DIAGNOSIS — M542 Cervicalgia: Secondary | ICD-10-CM | POA: Diagnosis not present

## 2018-07-15 DIAGNOSIS — M9903 Segmental and somatic dysfunction of lumbar region: Secondary | ICD-10-CM | POA: Diagnosis not present

## 2018-07-15 DIAGNOSIS — M9901 Segmental and somatic dysfunction of cervical region: Secondary | ICD-10-CM | POA: Diagnosis not present

## 2018-07-15 DIAGNOSIS — M9902 Segmental and somatic dysfunction of thoracic region: Secondary | ICD-10-CM | POA: Diagnosis not present

## 2018-07-15 DIAGNOSIS — M25551 Pain in right hip: Secondary | ICD-10-CM | POA: Diagnosis not present

## 2018-07-20 DIAGNOSIS — H2513 Age-related nuclear cataract, bilateral: Secondary | ICD-10-CM | POA: Diagnosis not present

## 2018-07-22 DIAGNOSIS — M5431 Sciatica, right side: Secondary | ICD-10-CM | POA: Diagnosis not present

## 2018-07-22 DIAGNOSIS — M542 Cervicalgia: Secondary | ICD-10-CM | POA: Diagnosis not present

## 2018-07-22 DIAGNOSIS — M9902 Segmental and somatic dysfunction of thoracic region: Secondary | ICD-10-CM | POA: Diagnosis not present

## 2018-07-22 DIAGNOSIS — M9901 Segmental and somatic dysfunction of cervical region: Secondary | ICD-10-CM | POA: Diagnosis not present

## 2018-07-22 DIAGNOSIS — M9903 Segmental and somatic dysfunction of lumbar region: Secondary | ICD-10-CM | POA: Diagnosis not present

## 2018-07-22 DIAGNOSIS — M25551 Pain in right hip: Secondary | ICD-10-CM | POA: Diagnosis not present

## 2018-08-05 DIAGNOSIS — M542 Cervicalgia: Secondary | ICD-10-CM | POA: Diagnosis not present

## 2018-08-05 DIAGNOSIS — M9903 Segmental and somatic dysfunction of lumbar region: Secondary | ICD-10-CM | POA: Diagnosis not present

## 2018-08-05 DIAGNOSIS — M5431 Sciatica, right side: Secondary | ICD-10-CM | POA: Diagnosis not present

## 2018-08-05 DIAGNOSIS — M9902 Segmental and somatic dysfunction of thoracic region: Secondary | ICD-10-CM | POA: Diagnosis not present

## 2018-08-05 DIAGNOSIS — M9901 Segmental and somatic dysfunction of cervical region: Secondary | ICD-10-CM | POA: Diagnosis not present

## 2018-08-05 DIAGNOSIS — M25551 Pain in right hip: Secondary | ICD-10-CM | POA: Diagnosis not present

## 2018-08-12 DIAGNOSIS — M25551 Pain in right hip: Secondary | ICD-10-CM | POA: Diagnosis not present

## 2018-08-12 DIAGNOSIS — M542 Cervicalgia: Secondary | ICD-10-CM | POA: Diagnosis not present

## 2018-08-12 DIAGNOSIS — M9903 Segmental and somatic dysfunction of lumbar region: Secondary | ICD-10-CM | POA: Diagnosis not present

## 2018-08-12 DIAGNOSIS — M5431 Sciatica, right side: Secondary | ICD-10-CM | POA: Diagnosis not present

## 2018-08-12 DIAGNOSIS — M9901 Segmental and somatic dysfunction of cervical region: Secondary | ICD-10-CM | POA: Diagnosis not present

## 2018-08-12 DIAGNOSIS — M9902 Segmental and somatic dysfunction of thoracic region: Secondary | ICD-10-CM | POA: Diagnosis not present

## 2018-08-19 DIAGNOSIS — M9901 Segmental and somatic dysfunction of cervical region: Secondary | ICD-10-CM | POA: Diagnosis not present

## 2018-08-19 DIAGNOSIS — M542 Cervicalgia: Secondary | ICD-10-CM | POA: Diagnosis not present

## 2018-08-19 DIAGNOSIS — M25551 Pain in right hip: Secondary | ICD-10-CM | POA: Diagnosis not present

## 2018-08-19 DIAGNOSIS — M5431 Sciatica, right side: Secondary | ICD-10-CM | POA: Diagnosis not present

## 2018-08-19 DIAGNOSIS — M9903 Segmental and somatic dysfunction of lumbar region: Secondary | ICD-10-CM | POA: Diagnosis not present

## 2018-08-19 DIAGNOSIS — M9902 Segmental and somatic dysfunction of thoracic region: Secondary | ICD-10-CM | POA: Diagnosis not present

## 2018-08-27 DIAGNOSIS — M542 Cervicalgia: Secondary | ICD-10-CM | POA: Diagnosis not present

## 2018-08-27 DIAGNOSIS — M5431 Sciatica, right side: Secondary | ICD-10-CM | POA: Diagnosis not present

## 2018-08-27 DIAGNOSIS — M9903 Segmental and somatic dysfunction of lumbar region: Secondary | ICD-10-CM | POA: Diagnosis not present

## 2018-08-27 DIAGNOSIS — M9901 Segmental and somatic dysfunction of cervical region: Secondary | ICD-10-CM | POA: Diagnosis not present

## 2018-08-27 DIAGNOSIS — M9902 Segmental and somatic dysfunction of thoracic region: Secondary | ICD-10-CM | POA: Diagnosis not present

## 2018-08-27 DIAGNOSIS — M25551 Pain in right hip: Secondary | ICD-10-CM | POA: Diagnosis not present

## 2018-09-09 DIAGNOSIS — M542 Cervicalgia: Secondary | ICD-10-CM | POA: Diagnosis not present

## 2018-09-09 DIAGNOSIS — M25551 Pain in right hip: Secondary | ICD-10-CM | POA: Diagnosis not present

## 2018-09-09 DIAGNOSIS — M9903 Segmental and somatic dysfunction of lumbar region: Secondary | ICD-10-CM | POA: Diagnosis not present

## 2018-09-09 DIAGNOSIS — M5431 Sciatica, right side: Secondary | ICD-10-CM | POA: Diagnosis not present

## 2018-09-09 DIAGNOSIS — M9902 Segmental and somatic dysfunction of thoracic region: Secondary | ICD-10-CM | POA: Diagnosis not present

## 2018-09-09 DIAGNOSIS — M9901 Segmental and somatic dysfunction of cervical region: Secondary | ICD-10-CM | POA: Diagnosis not present

## 2018-09-23 DIAGNOSIS — M9902 Segmental and somatic dysfunction of thoracic region: Secondary | ICD-10-CM | POA: Diagnosis not present

## 2018-09-23 DIAGNOSIS — M542 Cervicalgia: Secondary | ICD-10-CM | POA: Diagnosis not present

## 2018-09-23 DIAGNOSIS — M9903 Segmental and somatic dysfunction of lumbar region: Secondary | ICD-10-CM | POA: Diagnosis not present

## 2018-09-23 DIAGNOSIS — M5431 Sciatica, right side: Secondary | ICD-10-CM | POA: Diagnosis not present

## 2018-09-23 DIAGNOSIS — M25551 Pain in right hip: Secondary | ICD-10-CM | POA: Diagnosis not present

## 2018-09-23 DIAGNOSIS — M9901 Segmental and somatic dysfunction of cervical region: Secondary | ICD-10-CM | POA: Diagnosis not present

## 2018-10-07 DIAGNOSIS — M542 Cervicalgia: Secondary | ICD-10-CM | POA: Diagnosis not present

## 2018-10-07 DIAGNOSIS — M9901 Segmental and somatic dysfunction of cervical region: Secondary | ICD-10-CM | POA: Diagnosis not present

## 2018-10-07 DIAGNOSIS — M5431 Sciatica, right side: Secondary | ICD-10-CM | POA: Diagnosis not present

## 2018-10-07 DIAGNOSIS — M25551 Pain in right hip: Secondary | ICD-10-CM | POA: Diagnosis not present

## 2018-10-07 DIAGNOSIS — M9902 Segmental and somatic dysfunction of thoracic region: Secondary | ICD-10-CM | POA: Diagnosis not present

## 2018-10-07 DIAGNOSIS — M9903 Segmental and somatic dysfunction of lumbar region: Secondary | ICD-10-CM | POA: Diagnosis not present

## 2018-10-21 DIAGNOSIS — M9901 Segmental and somatic dysfunction of cervical region: Secondary | ICD-10-CM | POA: Diagnosis not present

## 2018-10-21 DIAGNOSIS — M9903 Segmental and somatic dysfunction of lumbar region: Secondary | ICD-10-CM | POA: Diagnosis not present

## 2018-10-21 DIAGNOSIS — M9902 Segmental and somatic dysfunction of thoracic region: Secondary | ICD-10-CM | POA: Diagnosis not present

## 2018-10-21 DIAGNOSIS — M25551 Pain in right hip: Secondary | ICD-10-CM | POA: Diagnosis not present

## 2018-10-21 DIAGNOSIS — M542 Cervicalgia: Secondary | ICD-10-CM | POA: Diagnosis not present

## 2018-10-21 DIAGNOSIS — M5431 Sciatica, right side: Secondary | ICD-10-CM | POA: Diagnosis not present

## 2018-11-01 DIAGNOSIS — M9903 Segmental and somatic dysfunction of lumbar region: Secondary | ICD-10-CM | POA: Diagnosis not present

## 2018-11-01 DIAGNOSIS — M9902 Segmental and somatic dysfunction of thoracic region: Secondary | ICD-10-CM | POA: Diagnosis not present

## 2018-11-01 DIAGNOSIS — M542 Cervicalgia: Secondary | ICD-10-CM | POA: Diagnosis not present

## 2018-11-01 DIAGNOSIS — M25551 Pain in right hip: Secondary | ICD-10-CM | POA: Diagnosis not present

## 2018-11-01 DIAGNOSIS — M5431 Sciatica, right side: Secondary | ICD-10-CM | POA: Diagnosis not present

## 2018-11-01 DIAGNOSIS — M9901 Segmental and somatic dysfunction of cervical region: Secondary | ICD-10-CM | POA: Diagnosis not present

## 2018-11-04 DIAGNOSIS — M9902 Segmental and somatic dysfunction of thoracic region: Secondary | ICD-10-CM | POA: Diagnosis not present

## 2018-11-04 DIAGNOSIS — M9901 Segmental and somatic dysfunction of cervical region: Secondary | ICD-10-CM | POA: Diagnosis not present

## 2018-11-04 DIAGNOSIS — M25551 Pain in right hip: Secondary | ICD-10-CM | POA: Diagnosis not present

## 2018-11-04 DIAGNOSIS — M9903 Segmental and somatic dysfunction of lumbar region: Secondary | ICD-10-CM | POA: Diagnosis not present

## 2018-11-04 DIAGNOSIS — M5431 Sciatica, right side: Secondary | ICD-10-CM | POA: Diagnosis not present

## 2018-11-04 DIAGNOSIS — M542 Cervicalgia: Secondary | ICD-10-CM | POA: Diagnosis not present

## 2018-11-08 DIAGNOSIS — M9903 Segmental and somatic dysfunction of lumbar region: Secondary | ICD-10-CM | POA: Diagnosis not present

## 2018-11-08 DIAGNOSIS — M9901 Segmental and somatic dysfunction of cervical region: Secondary | ICD-10-CM | POA: Diagnosis not present

## 2018-11-08 DIAGNOSIS — M9902 Segmental and somatic dysfunction of thoracic region: Secondary | ICD-10-CM | POA: Diagnosis not present

## 2018-11-08 DIAGNOSIS — M25551 Pain in right hip: Secondary | ICD-10-CM | POA: Diagnosis not present

## 2018-11-08 DIAGNOSIS — M542 Cervicalgia: Secondary | ICD-10-CM | POA: Diagnosis not present

## 2018-11-08 DIAGNOSIS — M5431 Sciatica, right side: Secondary | ICD-10-CM | POA: Diagnosis not present

## 2018-11-11 DIAGNOSIS — M9901 Segmental and somatic dysfunction of cervical region: Secondary | ICD-10-CM | POA: Diagnosis not present

## 2018-11-11 DIAGNOSIS — M542 Cervicalgia: Secondary | ICD-10-CM | POA: Diagnosis not present

## 2018-11-11 DIAGNOSIS — M9902 Segmental and somatic dysfunction of thoracic region: Secondary | ICD-10-CM | POA: Diagnosis not present

## 2018-11-11 DIAGNOSIS — M5431 Sciatica, right side: Secondary | ICD-10-CM | POA: Diagnosis not present

## 2018-11-11 DIAGNOSIS — M9903 Segmental and somatic dysfunction of lumbar region: Secondary | ICD-10-CM | POA: Diagnosis not present

## 2018-11-11 DIAGNOSIS — M25551 Pain in right hip: Secondary | ICD-10-CM | POA: Diagnosis not present

## 2018-11-18 DIAGNOSIS — M542 Cervicalgia: Secondary | ICD-10-CM | POA: Diagnosis not present

## 2018-11-18 DIAGNOSIS — M25551 Pain in right hip: Secondary | ICD-10-CM | POA: Diagnosis not present

## 2018-11-18 DIAGNOSIS — M9902 Segmental and somatic dysfunction of thoracic region: Secondary | ICD-10-CM | POA: Diagnosis not present

## 2018-11-18 DIAGNOSIS — M9901 Segmental and somatic dysfunction of cervical region: Secondary | ICD-10-CM | POA: Diagnosis not present

## 2018-11-18 DIAGNOSIS — M9903 Segmental and somatic dysfunction of lumbar region: Secondary | ICD-10-CM | POA: Diagnosis not present

## 2018-11-18 DIAGNOSIS — M5431 Sciatica, right side: Secondary | ICD-10-CM | POA: Diagnosis not present

## 2018-11-25 DIAGNOSIS — M542 Cervicalgia: Secondary | ICD-10-CM | POA: Diagnosis not present

## 2018-11-25 DIAGNOSIS — M9903 Segmental and somatic dysfunction of lumbar region: Secondary | ICD-10-CM | POA: Diagnosis not present

## 2018-11-25 DIAGNOSIS — Z0000 Encounter for general adult medical examination without abnormal findings: Secondary | ICD-10-CM | POA: Diagnosis not present

## 2018-11-25 DIAGNOSIS — M9901 Segmental and somatic dysfunction of cervical region: Secondary | ICD-10-CM | POA: Diagnosis not present

## 2018-11-25 DIAGNOSIS — E782 Mixed hyperlipidemia: Secondary | ICD-10-CM | POA: Diagnosis not present

## 2018-11-25 DIAGNOSIS — M5431 Sciatica, right side: Secondary | ICD-10-CM | POA: Diagnosis not present

## 2018-11-25 DIAGNOSIS — M818 Other osteoporosis without current pathological fracture: Secondary | ICD-10-CM | POA: Diagnosis not present

## 2018-11-25 DIAGNOSIS — M9902 Segmental and somatic dysfunction of thoracic region: Secondary | ICD-10-CM | POA: Diagnosis not present

## 2018-11-25 DIAGNOSIS — I10 Essential (primary) hypertension: Secondary | ICD-10-CM | POA: Diagnosis not present

## 2018-11-25 DIAGNOSIS — M25551 Pain in right hip: Secondary | ICD-10-CM | POA: Diagnosis not present

## 2018-12-09 DIAGNOSIS — M9901 Segmental and somatic dysfunction of cervical region: Secondary | ICD-10-CM | POA: Diagnosis not present

## 2018-12-09 DIAGNOSIS — M5431 Sciatica, right side: Secondary | ICD-10-CM | POA: Diagnosis not present

## 2018-12-09 DIAGNOSIS — M542 Cervicalgia: Secondary | ICD-10-CM | POA: Diagnosis not present

## 2018-12-09 DIAGNOSIS — M9903 Segmental and somatic dysfunction of lumbar region: Secondary | ICD-10-CM | POA: Diagnosis not present

## 2018-12-09 DIAGNOSIS — M9902 Segmental and somatic dysfunction of thoracic region: Secondary | ICD-10-CM | POA: Diagnosis not present

## 2018-12-09 DIAGNOSIS — M25551 Pain in right hip: Secondary | ICD-10-CM | POA: Diagnosis not present

## 2018-12-16 DIAGNOSIS — M9902 Segmental and somatic dysfunction of thoracic region: Secondary | ICD-10-CM | POA: Diagnosis not present

## 2018-12-16 DIAGNOSIS — M542 Cervicalgia: Secondary | ICD-10-CM | POA: Diagnosis not present

## 2018-12-16 DIAGNOSIS — M9903 Segmental and somatic dysfunction of lumbar region: Secondary | ICD-10-CM | POA: Diagnosis not present

## 2018-12-16 DIAGNOSIS — M25551 Pain in right hip: Secondary | ICD-10-CM | POA: Diagnosis not present

## 2018-12-16 DIAGNOSIS — M9901 Segmental and somatic dysfunction of cervical region: Secondary | ICD-10-CM | POA: Diagnosis not present

## 2018-12-16 DIAGNOSIS — M5431 Sciatica, right side: Secondary | ICD-10-CM | POA: Diagnosis not present

## 2018-12-30 DIAGNOSIS — M9903 Segmental and somatic dysfunction of lumbar region: Secondary | ICD-10-CM | POA: Diagnosis not present

## 2018-12-30 DIAGNOSIS — M9901 Segmental and somatic dysfunction of cervical region: Secondary | ICD-10-CM | POA: Diagnosis not present

## 2018-12-30 DIAGNOSIS — M9902 Segmental and somatic dysfunction of thoracic region: Secondary | ICD-10-CM | POA: Diagnosis not present

## 2018-12-30 DIAGNOSIS — M25551 Pain in right hip: Secondary | ICD-10-CM | POA: Diagnosis not present

## 2018-12-30 DIAGNOSIS — M542 Cervicalgia: Secondary | ICD-10-CM | POA: Diagnosis not present

## 2018-12-30 DIAGNOSIS — M5431 Sciatica, right side: Secondary | ICD-10-CM | POA: Diagnosis not present

## 2019-02-15 DIAGNOSIS — M25551 Pain in right hip: Secondary | ICD-10-CM | POA: Diagnosis not present

## 2019-02-15 DIAGNOSIS — M9902 Segmental and somatic dysfunction of thoracic region: Secondary | ICD-10-CM | POA: Diagnosis not present

## 2019-02-15 DIAGNOSIS — M9903 Segmental and somatic dysfunction of lumbar region: Secondary | ICD-10-CM | POA: Diagnosis not present

## 2019-02-15 DIAGNOSIS — M9901 Segmental and somatic dysfunction of cervical region: Secondary | ICD-10-CM | POA: Diagnosis not present

## 2019-02-15 DIAGNOSIS — M5431 Sciatica, right side: Secondary | ICD-10-CM | POA: Diagnosis not present

## 2019-02-15 DIAGNOSIS — M542 Cervicalgia: Secondary | ICD-10-CM | POA: Diagnosis not present

## 2019-03-01 DIAGNOSIS — M9902 Segmental and somatic dysfunction of thoracic region: Secondary | ICD-10-CM | POA: Diagnosis not present

## 2019-03-01 DIAGNOSIS — M25551 Pain in right hip: Secondary | ICD-10-CM | POA: Diagnosis not present

## 2019-03-01 DIAGNOSIS — M5431 Sciatica, right side: Secondary | ICD-10-CM | POA: Diagnosis not present

## 2019-03-01 DIAGNOSIS — M9903 Segmental and somatic dysfunction of lumbar region: Secondary | ICD-10-CM | POA: Diagnosis not present

## 2019-03-01 DIAGNOSIS — M542 Cervicalgia: Secondary | ICD-10-CM | POA: Diagnosis not present

## 2019-03-01 DIAGNOSIS — M9901 Segmental and somatic dysfunction of cervical region: Secondary | ICD-10-CM | POA: Diagnosis not present

## 2019-03-14 DIAGNOSIS — M9901 Segmental and somatic dysfunction of cervical region: Secondary | ICD-10-CM | POA: Diagnosis not present

## 2019-03-14 DIAGNOSIS — M9903 Segmental and somatic dysfunction of lumbar region: Secondary | ICD-10-CM | POA: Diagnosis not present

## 2019-03-14 DIAGNOSIS — M9902 Segmental and somatic dysfunction of thoracic region: Secondary | ICD-10-CM | POA: Diagnosis not present

## 2019-03-14 DIAGNOSIS — M542 Cervicalgia: Secondary | ICD-10-CM | POA: Diagnosis not present

## 2019-03-14 DIAGNOSIS — M5431 Sciatica, right side: Secondary | ICD-10-CM | POA: Diagnosis not present

## 2019-03-14 DIAGNOSIS — M25551 Pain in right hip: Secondary | ICD-10-CM | POA: Diagnosis not present

## 2019-03-29 DIAGNOSIS — M9903 Segmental and somatic dysfunction of lumbar region: Secondary | ICD-10-CM | POA: Diagnosis not present

## 2019-03-29 DIAGNOSIS — M5431 Sciatica, right side: Secondary | ICD-10-CM | POA: Diagnosis not present

## 2019-03-29 DIAGNOSIS — M9902 Segmental and somatic dysfunction of thoracic region: Secondary | ICD-10-CM | POA: Diagnosis not present

## 2019-03-29 DIAGNOSIS — M25551 Pain in right hip: Secondary | ICD-10-CM | POA: Diagnosis not present

## 2019-03-29 DIAGNOSIS — M542 Cervicalgia: Secondary | ICD-10-CM | POA: Diagnosis not present

## 2019-03-29 DIAGNOSIS — M9901 Segmental and somatic dysfunction of cervical region: Secondary | ICD-10-CM | POA: Diagnosis not present

## 2019-04-07 DIAGNOSIS — M25551 Pain in right hip: Secondary | ICD-10-CM | POA: Diagnosis not present

## 2019-04-07 DIAGNOSIS — M9903 Segmental and somatic dysfunction of lumbar region: Secondary | ICD-10-CM | POA: Diagnosis not present

## 2019-04-07 DIAGNOSIS — M9901 Segmental and somatic dysfunction of cervical region: Secondary | ICD-10-CM | POA: Diagnosis not present

## 2019-04-07 DIAGNOSIS — M9902 Segmental and somatic dysfunction of thoracic region: Secondary | ICD-10-CM | POA: Diagnosis not present

## 2019-04-07 DIAGNOSIS — M542 Cervicalgia: Secondary | ICD-10-CM | POA: Diagnosis not present

## 2019-04-07 DIAGNOSIS — M5431 Sciatica, right side: Secondary | ICD-10-CM | POA: Diagnosis not present

## 2019-04-21 DIAGNOSIS — M542 Cervicalgia: Secondary | ICD-10-CM | POA: Diagnosis not present

## 2019-04-21 DIAGNOSIS — M9902 Segmental and somatic dysfunction of thoracic region: Secondary | ICD-10-CM | POA: Diagnosis not present

## 2019-04-21 DIAGNOSIS — M9903 Segmental and somatic dysfunction of lumbar region: Secondary | ICD-10-CM | POA: Diagnosis not present

## 2019-04-21 DIAGNOSIS — M25551 Pain in right hip: Secondary | ICD-10-CM | POA: Diagnosis not present

## 2019-04-21 DIAGNOSIS — M9901 Segmental and somatic dysfunction of cervical region: Secondary | ICD-10-CM | POA: Diagnosis not present

## 2019-04-21 DIAGNOSIS — M5431 Sciatica, right side: Secondary | ICD-10-CM | POA: Diagnosis not present

## 2019-04-28 DIAGNOSIS — M9901 Segmental and somatic dysfunction of cervical region: Secondary | ICD-10-CM | POA: Diagnosis not present

## 2019-04-28 DIAGNOSIS — M9902 Segmental and somatic dysfunction of thoracic region: Secondary | ICD-10-CM | POA: Diagnosis not present

## 2019-04-28 DIAGNOSIS — M5431 Sciatica, right side: Secondary | ICD-10-CM | POA: Diagnosis not present

## 2019-04-28 DIAGNOSIS — M25551 Pain in right hip: Secondary | ICD-10-CM | POA: Diagnosis not present

## 2019-04-28 DIAGNOSIS — M542 Cervicalgia: Secondary | ICD-10-CM | POA: Diagnosis not present

## 2019-04-28 DIAGNOSIS — M9903 Segmental and somatic dysfunction of lumbar region: Secondary | ICD-10-CM | POA: Diagnosis not present

## 2019-05-12 DIAGNOSIS — M9901 Segmental and somatic dysfunction of cervical region: Secondary | ICD-10-CM | POA: Diagnosis not present

## 2019-05-12 DIAGNOSIS — M5431 Sciatica, right side: Secondary | ICD-10-CM | POA: Diagnosis not present

## 2019-05-12 DIAGNOSIS — M25551 Pain in right hip: Secondary | ICD-10-CM | POA: Diagnosis not present

## 2019-05-12 DIAGNOSIS — M9903 Segmental and somatic dysfunction of lumbar region: Secondary | ICD-10-CM | POA: Diagnosis not present

## 2019-05-12 DIAGNOSIS — M9902 Segmental and somatic dysfunction of thoracic region: Secondary | ICD-10-CM | POA: Diagnosis not present

## 2019-05-12 DIAGNOSIS — M542 Cervicalgia: Secondary | ICD-10-CM | POA: Diagnosis not present

## 2019-05-17 DIAGNOSIS — M25551 Pain in right hip: Secondary | ICD-10-CM | POA: Diagnosis not present

## 2019-05-17 DIAGNOSIS — M5431 Sciatica, right side: Secondary | ICD-10-CM | POA: Diagnosis not present

## 2019-05-17 DIAGNOSIS — M542 Cervicalgia: Secondary | ICD-10-CM | POA: Diagnosis not present

## 2019-05-17 DIAGNOSIS — M9903 Segmental and somatic dysfunction of lumbar region: Secondary | ICD-10-CM | POA: Diagnosis not present

## 2019-05-17 DIAGNOSIS — M9902 Segmental and somatic dysfunction of thoracic region: Secondary | ICD-10-CM | POA: Diagnosis not present

## 2019-05-17 DIAGNOSIS — M9901 Segmental and somatic dysfunction of cervical region: Secondary | ICD-10-CM | POA: Diagnosis not present

## 2019-05-24 DIAGNOSIS — M542 Cervicalgia: Secondary | ICD-10-CM | POA: Diagnosis not present

## 2019-05-24 DIAGNOSIS — M5431 Sciatica, right side: Secondary | ICD-10-CM | POA: Diagnosis not present

## 2019-05-24 DIAGNOSIS — M9903 Segmental and somatic dysfunction of lumbar region: Secondary | ICD-10-CM | POA: Diagnosis not present

## 2019-05-24 DIAGNOSIS — M9902 Segmental and somatic dysfunction of thoracic region: Secondary | ICD-10-CM | POA: Diagnosis not present

## 2019-05-24 DIAGNOSIS — M9901 Segmental and somatic dysfunction of cervical region: Secondary | ICD-10-CM | POA: Diagnosis not present

## 2019-05-24 DIAGNOSIS — M25551 Pain in right hip: Secondary | ICD-10-CM | POA: Diagnosis not present

## 2019-05-26 DIAGNOSIS — E782 Mixed hyperlipidemia: Secondary | ICD-10-CM | POA: Diagnosis not present

## 2019-05-26 DIAGNOSIS — M818 Other osteoporosis without current pathological fracture: Secondary | ICD-10-CM | POA: Diagnosis not present

## 2019-05-26 DIAGNOSIS — I10 Essential (primary) hypertension: Secondary | ICD-10-CM | POA: Diagnosis not present

## 2019-06-07 DIAGNOSIS — M542 Cervicalgia: Secondary | ICD-10-CM | POA: Diagnosis not present

## 2019-06-07 DIAGNOSIS — M9903 Segmental and somatic dysfunction of lumbar region: Secondary | ICD-10-CM | POA: Diagnosis not present

## 2019-06-07 DIAGNOSIS — M25551 Pain in right hip: Secondary | ICD-10-CM | POA: Diagnosis not present

## 2019-06-07 DIAGNOSIS — M9902 Segmental and somatic dysfunction of thoracic region: Secondary | ICD-10-CM | POA: Diagnosis not present

## 2019-06-07 DIAGNOSIS — M5431 Sciatica, right side: Secondary | ICD-10-CM | POA: Diagnosis not present

## 2019-06-07 DIAGNOSIS — M9901 Segmental and somatic dysfunction of cervical region: Secondary | ICD-10-CM | POA: Diagnosis not present

## 2019-06-14 DIAGNOSIS — M9902 Segmental and somatic dysfunction of thoracic region: Secondary | ICD-10-CM | POA: Diagnosis not present

## 2019-06-14 DIAGNOSIS — M9901 Segmental and somatic dysfunction of cervical region: Secondary | ICD-10-CM | POA: Diagnosis not present

## 2019-06-14 DIAGNOSIS — M9903 Segmental and somatic dysfunction of lumbar region: Secondary | ICD-10-CM | POA: Diagnosis not present

## 2019-06-14 DIAGNOSIS — M542 Cervicalgia: Secondary | ICD-10-CM | POA: Diagnosis not present

## 2019-06-14 DIAGNOSIS — M5431 Sciatica, right side: Secondary | ICD-10-CM | POA: Diagnosis not present

## 2019-06-14 DIAGNOSIS — M25551 Pain in right hip: Secondary | ICD-10-CM | POA: Diagnosis not present

## 2019-06-21 DIAGNOSIS — M542 Cervicalgia: Secondary | ICD-10-CM | POA: Diagnosis not present

## 2019-06-21 DIAGNOSIS — M9901 Segmental and somatic dysfunction of cervical region: Secondary | ICD-10-CM | POA: Diagnosis not present

## 2019-06-21 DIAGNOSIS — M9902 Segmental and somatic dysfunction of thoracic region: Secondary | ICD-10-CM | POA: Diagnosis not present

## 2019-06-21 DIAGNOSIS — M9903 Segmental and somatic dysfunction of lumbar region: Secondary | ICD-10-CM | POA: Diagnosis not present

## 2019-06-21 DIAGNOSIS — M5431 Sciatica, right side: Secondary | ICD-10-CM | POA: Diagnosis not present

## 2019-06-21 DIAGNOSIS — M25551 Pain in right hip: Secondary | ICD-10-CM | POA: Diagnosis not present

## 2019-08-18 DIAGNOSIS — M9901 Segmental and somatic dysfunction of cervical region: Secondary | ICD-10-CM | POA: Diagnosis not present

## 2019-08-18 DIAGNOSIS — M9902 Segmental and somatic dysfunction of thoracic region: Secondary | ICD-10-CM | POA: Diagnosis not present

## 2019-08-18 DIAGNOSIS — M5431 Sciatica, right side: Secondary | ICD-10-CM | POA: Diagnosis not present

## 2019-08-18 DIAGNOSIS — M542 Cervicalgia: Secondary | ICD-10-CM | POA: Diagnosis not present

## 2019-08-18 DIAGNOSIS — M25551 Pain in right hip: Secondary | ICD-10-CM | POA: Diagnosis not present

## 2019-08-18 DIAGNOSIS — M9903 Segmental and somatic dysfunction of lumbar region: Secondary | ICD-10-CM | POA: Diagnosis not present

## 2019-08-25 DIAGNOSIS — M9901 Segmental and somatic dysfunction of cervical region: Secondary | ICD-10-CM | POA: Diagnosis not present

## 2019-08-25 DIAGNOSIS — M9903 Segmental and somatic dysfunction of lumbar region: Secondary | ICD-10-CM | POA: Diagnosis not present

## 2019-08-25 DIAGNOSIS — M542 Cervicalgia: Secondary | ICD-10-CM | POA: Diagnosis not present

## 2019-08-25 DIAGNOSIS — M9902 Segmental and somatic dysfunction of thoracic region: Secondary | ICD-10-CM | POA: Diagnosis not present

## 2019-08-25 DIAGNOSIS — M25551 Pain in right hip: Secondary | ICD-10-CM | POA: Diagnosis not present

## 2019-08-25 DIAGNOSIS — M5431 Sciatica, right side: Secondary | ICD-10-CM | POA: Diagnosis not present

## 2019-09-08 DIAGNOSIS — M9902 Segmental and somatic dysfunction of thoracic region: Secondary | ICD-10-CM | POA: Diagnosis not present

## 2019-09-08 DIAGNOSIS — M9903 Segmental and somatic dysfunction of lumbar region: Secondary | ICD-10-CM | POA: Diagnosis not present

## 2019-09-08 DIAGNOSIS — M542 Cervicalgia: Secondary | ICD-10-CM | POA: Diagnosis not present

## 2019-09-08 DIAGNOSIS — M25551 Pain in right hip: Secondary | ICD-10-CM | POA: Diagnosis not present

## 2019-09-08 DIAGNOSIS — M9901 Segmental and somatic dysfunction of cervical region: Secondary | ICD-10-CM | POA: Diagnosis not present

## 2019-09-08 DIAGNOSIS — M5431 Sciatica, right side: Secondary | ICD-10-CM | POA: Diagnosis not present

## 2019-09-22 DIAGNOSIS — M9902 Segmental and somatic dysfunction of thoracic region: Secondary | ICD-10-CM | POA: Diagnosis not present

## 2019-09-22 DIAGNOSIS — M5431 Sciatica, right side: Secondary | ICD-10-CM | POA: Diagnosis not present

## 2019-09-22 DIAGNOSIS — M25551 Pain in right hip: Secondary | ICD-10-CM | POA: Diagnosis not present

## 2019-09-22 DIAGNOSIS — M9901 Segmental and somatic dysfunction of cervical region: Secondary | ICD-10-CM | POA: Diagnosis not present

## 2019-09-22 DIAGNOSIS — M9903 Segmental and somatic dysfunction of lumbar region: Secondary | ICD-10-CM | POA: Diagnosis not present

## 2019-09-22 DIAGNOSIS — M542 Cervicalgia: Secondary | ICD-10-CM | POA: Diagnosis not present

## 2019-10-06 DIAGNOSIS — M5431 Sciatica, right side: Secondary | ICD-10-CM | POA: Diagnosis not present

## 2019-10-06 DIAGNOSIS — M9902 Segmental and somatic dysfunction of thoracic region: Secondary | ICD-10-CM | POA: Diagnosis not present

## 2019-10-06 DIAGNOSIS — M542 Cervicalgia: Secondary | ICD-10-CM | POA: Diagnosis not present

## 2019-10-06 DIAGNOSIS — M9901 Segmental and somatic dysfunction of cervical region: Secondary | ICD-10-CM | POA: Diagnosis not present

## 2019-10-06 DIAGNOSIS — M9903 Segmental and somatic dysfunction of lumbar region: Secondary | ICD-10-CM | POA: Diagnosis not present

## 2019-10-06 DIAGNOSIS — M25551 Pain in right hip: Secondary | ICD-10-CM | POA: Diagnosis not present

## 2019-10-07 DIAGNOSIS — H2513 Age-related nuclear cataract, bilateral: Secondary | ICD-10-CM | POA: Diagnosis not present

## 2019-10-17 DIAGNOSIS — M25551 Pain in right hip: Secondary | ICD-10-CM | POA: Diagnosis not present

## 2019-10-17 DIAGNOSIS — M5431 Sciatica, right side: Secondary | ICD-10-CM | POA: Diagnosis not present

## 2019-10-17 DIAGNOSIS — M9901 Segmental and somatic dysfunction of cervical region: Secondary | ICD-10-CM | POA: Diagnosis not present

## 2019-10-17 DIAGNOSIS — M9902 Segmental and somatic dysfunction of thoracic region: Secondary | ICD-10-CM | POA: Diagnosis not present

## 2019-10-17 DIAGNOSIS — M9903 Segmental and somatic dysfunction of lumbar region: Secondary | ICD-10-CM | POA: Diagnosis not present

## 2019-10-17 DIAGNOSIS — M542 Cervicalgia: Secondary | ICD-10-CM | POA: Diagnosis not present

## 2019-10-31 DIAGNOSIS — M9903 Segmental and somatic dysfunction of lumbar region: Secondary | ICD-10-CM | POA: Diagnosis not present

## 2019-10-31 DIAGNOSIS — M9902 Segmental and somatic dysfunction of thoracic region: Secondary | ICD-10-CM | POA: Diagnosis not present

## 2019-10-31 DIAGNOSIS — M5431 Sciatica, right side: Secondary | ICD-10-CM | POA: Diagnosis not present

## 2019-10-31 DIAGNOSIS — M25551 Pain in right hip: Secondary | ICD-10-CM | POA: Diagnosis not present

## 2019-10-31 DIAGNOSIS — M9901 Segmental and somatic dysfunction of cervical region: Secondary | ICD-10-CM | POA: Diagnosis not present

## 2019-10-31 DIAGNOSIS — M542 Cervicalgia: Secondary | ICD-10-CM | POA: Diagnosis not present

## 2019-11-14 DIAGNOSIS — M25551 Pain in right hip: Secondary | ICD-10-CM | POA: Diagnosis not present

## 2019-11-14 DIAGNOSIS — M5431 Sciatica, right side: Secondary | ICD-10-CM | POA: Diagnosis not present

## 2019-11-14 DIAGNOSIS — M9902 Segmental and somatic dysfunction of thoracic region: Secondary | ICD-10-CM | POA: Diagnosis not present

## 2019-11-14 DIAGNOSIS — M542 Cervicalgia: Secondary | ICD-10-CM | POA: Diagnosis not present

## 2019-11-14 DIAGNOSIS — M9901 Segmental and somatic dysfunction of cervical region: Secondary | ICD-10-CM | POA: Diagnosis not present

## 2019-11-14 DIAGNOSIS — M9903 Segmental and somatic dysfunction of lumbar region: Secondary | ICD-10-CM | POA: Diagnosis not present

## 2019-11-21 DIAGNOSIS — M5431 Sciatica, right side: Secondary | ICD-10-CM | POA: Diagnosis not present

## 2019-11-21 DIAGNOSIS — M25551 Pain in right hip: Secondary | ICD-10-CM | POA: Diagnosis not present

## 2019-11-21 DIAGNOSIS — M542 Cervicalgia: Secondary | ICD-10-CM | POA: Diagnosis not present

## 2019-11-21 DIAGNOSIS — M9902 Segmental and somatic dysfunction of thoracic region: Secondary | ICD-10-CM | POA: Diagnosis not present

## 2019-11-21 DIAGNOSIS — M9903 Segmental and somatic dysfunction of lumbar region: Secondary | ICD-10-CM | POA: Diagnosis not present

## 2019-11-21 DIAGNOSIS — M9901 Segmental and somatic dysfunction of cervical region: Secondary | ICD-10-CM | POA: Diagnosis not present

## 2019-11-24 DIAGNOSIS — Z1331 Encounter for screening for depression: Secondary | ICD-10-CM | POA: Diagnosis not present

## 2019-11-24 DIAGNOSIS — E782 Mixed hyperlipidemia: Secondary | ICD-10-CM | POA: Diagnosis not present

## 2019-11-24 DIAGNOSIS — Z0000 Encounter for general adult medical examination without abnormal findings: Secondary | ICD-10-CM | POA: Diagnosis not present

## 2019-11-24 DIAGNOSIS — Z131 Encounter for screening for diabetes mellitus: Secondary | ICD-10-CM | POA: Diagnosis not present

## 2019-11-24 DIAGNOSIS — M818 Other osteoporosis without current pathological fracture: Secondary | ICD-10-CM | POA: Diagnosis not present

## 2019-11-24 DIAGNOSIS — I10 Essential (primary) hypertension: Secondary | ICD-10-CM | POA: Diagnosis not present

## 2019-12-05 DIAGNOSIS — M542 Cervicalgia: Secondary | ICD-10-CM | POA: Diagnosis not present

## 2019-12-05 DIAGNOSIS — M9901 Segmental and somatic dysfunction of cervical region: Secondary | ICD-10-CM | POA: Diagnosis not present

## 2019-12-05 DIAGNOSIS — M25551 Pain in right hip: Secondary | ICD-10-CM | POA: Diagnosis not present

## 2019-12-05 DIAGNOSIS — M5431 Sciatica, right side: Secondary | ICD-10-CM | POA: Diagnosis not present

## 2019-12-05 DIAGNOSIS — M9903 Segmental and somatic dysfunction of lumbar region: Secondary | ICD-10-CM | POA: Diagnosis not present

## 2019-12-05 DIAGNOSIS — M9902 Segmental and somatic dysfunction of thoracic region: Secondary | ICD-10-CM | POA: Diagnosis not present

## 2019-12-12 DIAGNOSIS — M25551 Pain in right hip: Secondary | ICD-10-CM | POA: Diagnosis not present

## 2019-12-12 DIAGNOSIS — M5431 Sciatica, right side: Secondary | ICD-10-CM | POA: Diagnosis not present

## 2019-12-12 DIAGNOSIS — M9901 Segmental and somatic dysfunction of cervical region: Secondary | ICD-10-CM | POA: Diagnosis not present

## 2019-12-12 DIAGNOSIS — M9902 Segmental and somatic dysfunction of thoracic region: Secondary | ICD-10-CM | POA: Diagnosis not present

## 2019-12-12 DIAGNOSIS — M9903 Segmental and somatic dysfunction of lumbar region: Secondary | ICD-10-CM | POA: Diagnosis not present

## 2019-12-12 DIAGNOSIS — M542 Cervicalgia: Secondary | ICD-10-CM | POA: Diagnosis not present

## 2019-12-19 DIAGNOSIS — M5431 Sciatica, right side: Secondary | ICD-10-CM | POA: Diagnosis not present

## 2019-12-19 DIAGNOSIS — M542 Cervicalgia: Secondary | ICD-10-CM | POA: Diagnosis not present

## 2019-12-19 DIAGNOSIS — M9902 Segmental and somatic dysfunction of thoracic region: Secondary | ICD-10-CM | POA: Diagnosis not present

## 2019-12-19 DIAGNOSIS — M9901 Segmental and somatic dysfunction of cervical region: Secondary | ICD-10-CM | POA: Diagnosis not present

## 2019-12-19 DIAGNOSIS — M25551 Pain in right hip: Secondary | ICD-10-CM | POA: Diagnosis not present

## 2019-12-19 DIAGNOSIS — M9903 Segmental and somatic dysfunction of lumbar region: Secondary | ICD-10-CM | POA: Diagnosis not present

## 2020-01-05 DIAGNOSIS — M542 Cervicalgia: Secondary | ICD-10-CM | POA: Diagnosis not present

## 2020-01-05 DIAGNOSIS — M9903 Segmental and somatic dysfunction of lumbar region: Secondary | ICD-10-CM | POA: Diagnosis not present

## 2020-01-05 DIAGNOSIS — M9902 Segmental and somatic dysfunction of thoracic region: Secondary | ICD-10-CM | POA: Diagnosis not present

## 2020-01-05 DIAGNOSIS — M25551 Pain in right hip: Secondary | ICD-10-CM | POA: Diagnosis not present

## 2020-01-05 DIAGNOSIS — M9901 Segmental and somatic dysfunction of cervical region: Secondary | ICD-10-CM | POA: Diagnosis not present

## 2020-01-05 DIAGNOSIS — M5431 Sciatica, right side: Secondary | ICD-10-CM | POA: Diagnosis not present

## 2020-02-16 DIAGNOSIS — M25551 Pain in right hip: Secondary | ICD-10-CM | POA: Diagnosis not present

## 2020-02-16 DIAGNOSIS — M5431 Sciatica, right side: Secondary | ICD-10-CM | POA: Diagnosis not present

## 2020-02-16 DIAGNOSIS — M9901 Segmental and somatic dysfunction of cervical region: Secondary | ICD-10-CM | POA: Diagnosis not present

## 2020-02-16 DIAGNOSIS — M9902 Segmental and somatic dysfunction of thoracic region: Secondary | ICD-10-CM | POA: Diagnosis not present

## 2020-02-16 DIAGNOSIS — M9903 Segmental and somatic dysfunction of lumbar region: Secondary | ICD-10-CM | POA: Diagnosis not present

## 2020-02-16 DIAGNOSIS — M542 Cervicalgia: Secondary | ICD-10-CM | POA: Diagnosis not present

## 2020-02-23 DIAGNOSIS — M9902 Segmental and somatic dysfunction of thoracic region: Secondary | ICD-10-CM | POA: Diagnosis not present

## 2020-02-23 DIAGNOSIS — M5431 Sciatica, right side: Secondary | ICD-10-CM | POA: Diagnosis not present

## 2020-02-23 DIAGNOSIS — M25551 Pain in right hip: Secondary | ICD-10-CM | POA: Diagnosis not present

## 2020-02-23 DIAGNOSIS — M9901 Segmental and somatic dysfunction of cervical region: Secondary | ICD-10-CM | POA: Diagnosis not present

## 2020-02-23 DIAGNOSIS — M542 Cervicalgia: Secondary | ICD-10-CM | POA: Diagnosis not present

## 2020-02-23 DIAGNOSIS — M9903 Segmental and somatic dysfunction of lumbar region: Secondary | ICD-10-CM | POA: Diagnosis not present

## 2020-03-05 DIAGNOSIS — M9901 Segmental and somatic dysfunction of cervical region: Secondary | ICD-10-CM | POA: Diagnosis not present

## 2020-03-05 DIAGNOSIS — M9903 Segmental and somatic dysfunction of lumbar region: Secondary | ICD-10-CM | POA: Diagnosis not present

## 2020-03-05 DIAGNOSIS — M9902 Segmental and somatic dysfunction of thoracic region: Secondary | ICD-10-CM | POA: Diagnosis not present

## 2020-03-05 DIAGNOSIS — M5431 Sciatica, right side: Secondary | ICD-10-CM | POA: Diagnosis not present

## 2020-03-05 DIAGNOSIS — M25551 Pain in right hip: Secondary | ICD-10-CM | POA: Diagnosis not present

## 2020-03-05 DIAGNOSIS — M542 Cervicalgia: Secondary | ICD-10-CM | POA: Diagnosis not present

## 2020-03-12 DIAGNOSIS — M9902 Segmental and somatic dysfunction of thoracic region: Secondary | ICD-10-CM | POA: Diagnosis not present

## 2020-03-12 DIAGNOSIS — M9903 Segmental and somatic dysfunction of lumbar region: Secondary | ICD-10-CM | POA: Diagnosis not present

## 2020-03-12 DIAGNOSIS — M542 Cervicalgia: Secondary | ICD-10-CM | POA: Diagnosis not present

## 2020-03-12 DIAGNOSIS — M9901 Segmental and somatic dysfunction of cervical region: Secondary | ICD-10-CM | POA: Diagnosis not present

## 2020-03-12 DIAGNOSIS — M25551 Pain in right hip: Secondary | ICD-10-CM | POA: Diagnosis not present

## 2020-03-12 DIAGNOSIS — M5431 Sciatica, right side: Secondary | ICD-10-CM | POA: Diagnosis not present

## 2020-04-02 DIAGNOSIS — M5431 Sciatica, right side: Secondary | ICD-10-CM | POA: Diagnosis not present

## 2020-04-02 DIAGNOSIS — M9903 Segmental and somatic dysfunction of lumbar region: Secondary | ICD-10-CM | POA: Diagnosis not present

## 2020-04-02 DIAGNOSIS — M9901 Segmental and somatic dysfunction of cervical region: Secondary | ICD-10-CM | POA: Diagnosis not present

## 2020-04-02 DIAGNOSIS — M542 Cervicalgia: Secondary | ICD-10-CM | POA: Diagnosis not present

## 2020-04-02 DIAGNOSIS — M25551 Pain in right hip: Secondary | ICD-10-CM | POA: Diagnosis not present

## 2020-04-02 DIAGNOSIS — M9902 Segmental and somatic dysfunction of thoracic region: Secondary | ICD-10-CM | POA: Diagnosis not present

## 2020-04-16 DIAGNOSIS — M9901 Segmental and somatic dysfunction of cervical region: Secondary | ICD-10-CM | POA: Diagnosis not present

## 2020-04-16 DIAGNOSIS — M9903 Segmental and somatic dysfunction of lumbar region: Secondary | ICD-10-CM | POA: Diagnosis not present

## 2020-04-16 DIAGNOSIS — M25551 Pain in right hip: Secondary | ICD-10-CM | POA: Diagnosis not present

## 2020-04-16 DIAGNOSIS — M9902 Segmental and somatic dysfunction of thoracic region: Secondary | ICD-10-CM | POA: Diagnosis not present

## 2020-04-16 DIAGNOSIS — M5431 Sciatica, right side: Secondary | ICD-10-CM | POA: Diagnosis not present

## 2020-04-16 DIAGNOSIS — M542 Cervicalgia: Secondary | ICD-10-CM | POA: Diagnosis not present

## 2020-04-30 DIAGNOSIS — M25551 Pain in right hip: Secondary | ICD-10-CM | POA: Diagnosis not present

## 2020-04-30 DIAGNOSIS — M5431 Sciatica, right side: Secondary | ICD-10-CM | POA: Diagnosis not present

## 2020-04-30 DIAGNOSIS — M9902 Segmental and somatic dysfunction of thoracic region: Secondary | ICD-10-CM | POA: Diagnosis not present

## 2020-04-30 DIAGNOSIS — M542 Cervicalgia: Secondary | ICD-10-CM | POA: Diagnosis not present

## 2020-04-30 DIAGNOSIS — M9903 Segmental and somatic dysfunction of lumbar region: Secondary | ICD-10-CM | POA: Diagnosis not present

## 2020-04-30 DIAGNOSIS — M9901 Segmental and somatic dysfunction of cervical region: Secondary | ICD-10-CM | POA: Diagnosis not present

## 2020-05-07 DIAGNOSIS — M5431 Sciatica, right side: Secondary | ICD-10-CM | POA: Diagnosis not present

## 2020-05-07 DIAGNOSIS — M9901 Segmental and somatic dysfunction of cervical region: Secondary | ICD-10-CM | POA: Diagnosis not present

## 2020-05-07 DIAGNOSIS — M25551 Pain in right hip: Secondary | ICD-10-CM | POA: Diagnosis not present

## 2020-05-07 DIAGNOSIS — M542 Cervicalgia: Secondary | ICD-10-CM | POA: Diagnosis not present

## 2020-05-07 DIAGNOSIS — M9902 Segmental and somatic dysfunction of thoracic region: Secondary | ICD-10-CM | POA: Diagnosis not present

## 2020-05-07 DIAGNOSIS — M9903 Segmental and somatic dysfunction of lumbar region: Secondary | ICD-10-CM | POA: Diagnosis not present

## 2020-05-21 DIAGNOSIS — M542 Cervicalgia: Secondary | ICD-10-CM | POA: Diagnosis not present

## 2020-05-21 DIAGNOSIS — M5431 Sciatica, right side: Secondary | ICD-10-CM | POA: Diagnosis not present

## 2020-05-21 DIAGNOSIS — M9903 Segmental and somatic dysfunction of lumbar region: Secondary | ICD-10-CM | POA: Diagnosis not present

## 2020-05-21 DIAGNOSIS — M9901 Segmental and somatic dysfunction of cervical region: Secondary | ICD-10-CM | POA: Diagnosis not present

## 2020-05-21 DIAGNOSIS — M9902 Segmental and somatic dysfunction of thoracic region: Secondary | ICD-10-CM | POA: Diagnosis not present

## 2020-05-21 DIAGNOSIS — M25551 Pain in right hip: Secondary | ICD-10-CM | POA: Diagnosis not present

## 2020-05-24 DIAGNOSIS — M818 Other osteoporosis without current pathological fracture: Secondary | ICD-10-CM | POA: Diagnosis not present

## 2020-05-24 DIAGNOSIS — I10 Essential (primary) hypertension: Secondary | ICD-10-CM | POA: Diagnosis not present

## 2020-05-24 DIAGNOSIS — E782 Mixed hyperlipidemia: Secondary | ICD-10-CM | POA: Diagnosis not present

## 2020-05-28 DIAGNOSIS — M9901 Segmental and somatic dysfunction of cervical region: Secondary | ICD-10-CM | POA: Diagnosis not present

## 2020-05-28 DIAGNOSIS — M5431 Sciatica, right side: Secondary | ICD-10-CM | POA: Diagnosis not present

## 2020-05-28 DIAGNOSIS — M9902 Segmental and somatic dysfunction of thoracic region: Secondary | ICD-10-CM | POA: Diagnosis not present

## 2020-05-28 DIAGNOSIS — M25551 Pain in right hip: Secondary | ICD-10-CM | POA: Diagnosis not present

## 2020-05-28 DIAGNOSIS — M9903 Segmental and somatic dysfunction of lumbar region: Secondary | ICD-10-CM | POA: Diagnosis not present

## 2020-05-28 DIAGNOSIS — M542 Cervicalgia: Secondary | ICD-10-CM | POA: Diagnosis not present

## 2020-06-04 DIAGNOSIS — M542 Cervicalgia: Secondary | ICD-10-CM | POA: Diagnosis not present

## 2020-06-04 DIAGNOSIS — M9901 Segmental and somatic dysfunction of cervical region: Secondary | ICD-10-CM | POA: Diagnosis not present

## 2020-06-04 DIAGNOSIS — M9903 Segmental and somatic dysfunction of lumbar region: Secondary | ICD-10-CM | POA: Diagnosis not present

## 2020-06-04 DIAGNOSIS — M9902 Segmental and somatic dysfunction of thoracic region: Secondary | ICD-10-CM | POA: Diagnosis not present

## 2020-06-04 DIAGNOSIS — M25551 Pain in right hip: Secondary | ICD-10-CM | POA: Diagnosis not present

## 2020-06-04 DIAGNOSIS — M5431 Sciatica, right side: Secondary | ICD-10-CM | POA: Diagnosis not present

## 2020-06-18 DIAGNOSIS — M25551 Pain in right hip: Secondary | ICD-10-CM | POA: Diagnosis not present

## 2020-06-18 DIAGNOSIS — M5431 Sciatica, right side: Secondary | ICD-10-CM | POA: Diagnosis not present

## 2020-06-18 DIAGNOSIS — M9903 Segmental and somatic dysfunction of lumbar region: Secondary | ICD-10-CM | POA: Diagnosis not present

## 2020-06-18 DIAGNOSIS — M9901 Segmental and somatic dysfunction of cervical region: Secondary | ICD-10-CM | POA: Diagnosis not present

## 2020-06-18 DIAGNOSIS — M9902 Segmental and somatic dysfunction of thoracic region: Secondary | ICD-10-CM | POA: Diagnosis not present

## 2020-06-18 DIAGNOSIS — M542 Cervicalgia: Secondary | ICD-10-CM | POA: Diagnosis not present

## 2020-07-02 DIAGNOSIS — M542 Cervicalgia: Secondary | ICD-10-CM | POA: Diagnosis not present

## 2020-07-02 DIAGNOSIS — M9903 Segmental and somatic dysfunction of lumbar region: Secondary | ICD-10-CM | POA: Diagnosis not present

## 2020-07-02 DIAGNOSIS — M9902 Segmental and somatic dysfunction of thoracic region: Secondary | ICD-10-CM | POA: Diagnosis not present

## 2020-07-02 DIAGNOSIS — M9901 Segmental and somatic dysfunction of cervical region: Secondary | ICD-10-CM | POA: Diagnosis not present

## 2020-07-02 DIAGNOSIS — M5431 Sciatica, right side: Secondary | ICD-10-CM | POA: Diagnosis not present

## 2020-07-02 DIAGNOSIS — M25551 Pain in right hip: Secondary | ICD-10-CM | POA: Diagnosis not present

## 2020-08-06 DIAGNOSIS — M9903 Segmental and somatic dysfunction of lumbar region: Secondary | ICD-10-CM | POA: Diagnosis not present

## 2020-08-06 DIAGNOSIS — M9901 Segmental and somatic dysfunction of cervical region: Secondary | ICD-10-CM | POA: Diagnosis not present

## 2020-08-06 DIAGNOSIS — M542 Cervicalgia: Secondary | ICD-10-CM | POA: Diagnosis not present

## 2020-08-06 DIAGNOSIS — M9902 Segmental and somatic dysfunction of thoracic region: Secondary | ICD-10-CM | POA: Diagnosis not present

## 2020-08-06 DIAGNOSIS — M25551 Pain in right hip: Secondary | ICD-10-CM | POA: Diagnosis not present

## 2020-08-06 DIAGNOSIS — M5431 Sciatica, right side: Secondary | ICD-10-CM | POA: Diagnosis not present

## 2020-08-09 DIAGNOSIS — H2513 Age-related nuclear cataract, bilateral: Secondary | ICD-10-CM | POA: Diagnosis not present

## 2020-08-09 DIAGNOSIS — H524 Presbyopia: Secondary | ICD-10-CM | POA: Diagnosis not present

## 2020-08-09 DIAGNOSIS — H43813 Vitreous degeneration, bilateral: Secondary | ICD-10-CM | POA: Diagnosis not present

## 2020-08-20 DIAGNOSIS — M9901 Segmental and somatic dysfunction of cervical region: Secondary | ICD-10-CM | POA: Diagnosis not present

## 2020-08-20 DIAGNOSIS — M25551 Pain in right hip: Secondary | ICD-10-CM | POA: Diagnosis not present

## 2020-08-20 DIAGNOSIS — M9903 Segmental and somatic dysfunction of lumbar region: Secondary | ICD-10-CM | POA: Diagnosis not present

## 2020-08-20 DIAGNOSIS — M9902 Segmental and somatic dysfunction of thoracic region: Secondary | ICD-10-CM | POA: Diagnosis not present

## 2020-08-20 DIAGNOSIS — M5431 Sciatica, right side: Secondary | ICD-10-CM | POA: Diagnosis not present

## 2020-08-20 DIAGNOSIS — M542 Cervicalgia: Secondary | ICD-10-CM | POA: Diagnosis not present

## 2020-09-10 DIAGNOSIS — M25551 Pain in right hip: Secondary | ICD-10-CM | POA: Diagnosis not present

## 2020-09-10 DIAGNOSIS — M5431 Sciatica, right side: Secondary | ICD-10-CM | POA: Diagnosis not present

## 2020-09-10 DIAGNOSIS — M9901 Segmental and somatic dysfunction of cervical region: Secondary | ICD-10-CM | POA: Diagnosis not present

## 2020-09-10 DIAGNOSIS — M9903 Segmental and somatic dysfunction of lumbar region: Secondary | ICD-10-CM | POA: Diagnosis not present

## 2020-09-10 DIAGNOSIS — M542 Cervicalgia: Secondary | ICD-10-CM | POA: Diagnosis not present

## 2020-09-10 DIAGNOSIS — M9902 Segmental and somatic dysfunction of thoracic region: Secondary | ICD-10-CM | POA: Diagnosis not present

## 2020-10-02 DIAGNOSIS — M542 Cervicalgia: Secondary | ICD-10-CM | POA: Diagnosis not present

## 2020-10-02 DIAGNOSIS — M9903 Segmental and somatic dysfunction of lumbar region: Secondary | ICD-10-CM | POA: Diagnosis not present

## 2020-10-02 DIAGNOSIS — M5431 Sciatica, right side: Secondary | ICD-10-CM | POA: Diagnosis not present

## 2020-10-02 DIAGNOSIS — M25551 Pain in right hip: Secondary | ICD-10-CM | POA: Diagnosis not present

## 2020-10-02 DIAGNOSIS — M9901 Segmental and somatic dysfunction of cervical region: Secondary | ICD-10-CM | POA: Diagnosis not present

## 2020-10-02 DIAGNOSIS — M9902 Segmental and somatic dysfunction of thoracic region: Secondary | ICD-10-CM | POA: Diagnosis not present

## 2020-10-29 DIAGNOSIS — M9903 Segmental and somatic dysfunction of lumbar region: Secondary | ICD-10-CM | POA: Diagnosis not present

## 2020-10-29 DIAGNOSIS — M5431 Sciatica, right side: Secondary | ICD-10-CM | POA: Diagnosis not present

## 2020-10-29 DIAGNOSIS — M542 Cervicalgia: Secondary | ICD-10-CM | POA: Diagnosis not present

## 2020-10-29 DIAGNOSIS — M9902 Segmental and somatic dysfunction of thoracic region: Secondary | ICD-10-CM | POA: Diagnosis not present

## 2020-10-29 DIAGNOSIS — M9901 Segmental and somatic dysfunction of cervical region: Secondary | ICD-10-CM | POA: Diagnosis not present

## 2020-10-29 DIAGNOSIS — M25551 Pain in right hip: Secondary | ICD-10-CM | POA: Diagnosis not present

## 2020-11-15 DIAGNOSIS — Z20822 Contact with and (suspected) exposure to covid-19: Secondary | ICD-10-CM | POA: Diagnosis not present

## 2020-11-15 DIAGNOSIS — B974 Respiratory syncytial virus as the cause of diseases classified elsewhere: Secondary | ICD-10-CM | POA: Diagnosis not present

## 2020-11-22 DIAGNOSIS — J680 Bronchitis and pneumonitis due to chemicals, gases, fumes and vapors: Secondary | ICD-10-CM | POA: Diagnosis not present

## 2020-11-22 DIAGNOSIS — R7303 Prediabetes: Secondary | ICD-10-CM | POA: Diagnosis not present

## 2020-11-22 DIAGNOSIS — J40 Bronchitis, not specified as acute or chronic: Secondary | ICD-10-CM | POA: Diagnosis not present

## 2020-11-22 DIAGNOSIS — E782 Mixed hyperlipidemia: Secondary | ICD-10-CM | POA: Diagnosis not present

## 2020-11-22 DIAGNOSIS — I10 Essential (primary) hypertension: Secondary | ICD-10-CM | POA: Diagnosis not present

## 2020-11-22 DIAGNOSIS — M818 Other osteoporosis without current pathological fracture: Secondary | ICD-10-CM | POA: Diagnosis not present

## 2020-11-26 DIAGNOSIS — M5431 Sciatica, right side: Secondary | ICD-10-CM | POA: Diagnosis not present

## 2020-11-26 DIAGNOSIS — M9903 Segmental and somatic dysfunction of lumbar region: Secondary | ICD-10-CM | POA: Diagnosis not present

## 2020-11-26 DIAGNOSIS — M542 Cervicalgia: Secondary | ICD-10-CM | POA: Diagnosis not present

## 2020-11-26 DIAGNOSIS — M9902 Segmental and somatic dysfunction of thoracic region: Secondary | ICD-10-CM | POA: Diagnosis not present

## 2020-11-26 DIAGNOSIS — M9901 Segmental and somatic dysfunction of cervical region: Secondary | ICD-10-CM | POA: Diagnosis not present

## 2020-11-26 DIAGNOSIS — M25551 Pain in right hip: Secondary | ICD-10-CM | POA: Diagnosis not present

## 2020-12-24 DIAGNOSIS — M542 Cervicalgia: Secondary | ICD-10-CM | POA: Diagnosis not present

## 2020-12-24 DIAGNOSIS — M25551 Pain in right hip: Secondary | ICD-10-CM | POA: Diagnosis not present

## 2020-12-24 DIAGNOSIS — M9903 Segmental and somatic dysfunction of lumbar region: Secondary | ICD-10-CM | POA: Diagnosis not present

## 2020-12-24 DIAGNOSIS — M5431 Sciatica, right side: Secondary | ICD-10-CM | POA: Diagnosis not present

## 2020-12-24 DIAGNOSIS — M9901 Segmental and somatic dysfunction of cervical region: Secondary | ICD-10-CM | POA: Diagnosis not present

## 2020-12-24 DIAGNOSIS — M9902 Segmental and somatic dysfunction of thoracic region: Secondary | ICD-10-CM | POA: Diagnosis not present

## 2020-12-31 DIAGNOSIS — M9903 Segmental and somatic dysfunction of lumbar region: Secondary | ICD-10-CM | POA: Diagnosis not present

## 2020-12-31 DIAGNOSIS — M5431 Sciatica, right side: Secondary | ICD-10-CM | POA: Diagnosis not present

## 2020-12-31 DIAGNOSIS — M9901 Segmental and somatic dysfunction of cervical region: Secondary | ICD-10-CM | POA: Diagnosis not present

## 2020-12-31 DIAGNOSIS — M542 Cervicalgia: Secondary | ICD-10-CM | POA: Diagnosis not present

## 2020-12-31 DIAGNOSIS — M25551 Pain in right hip: Secondary | ICD-10-CM | POA: Diagnosis not present

## 2020-12-31 DIAGNOSIS — M9902 Segmental and somatic dysfunction of thoracic region: Secondary | ICD-10-CM | POA: Diagnosis not present

## 2021-02-04 DIAGNOSIS — M25551 Pain in right hip: Secondary | ICD-10-CM | POA: Diagnosis not present

## 2021-02-04 DIAGNOSIS — M9901 Segmental and somatic dysfunction of cervical region: Secondary | ICD-10-CM | POA: Diagnosis not present

## 2021-02-04 DIAGNOSIS — M9902 Segmental and somatic dysfunction of thoracic region: Secondary | ICD-10-CM | POA: Diagnosis not present

## 2021-02-04 DIAGNOSIS — M542 Cervicalgia: Secondary | ICD-10-CM | POA: Diagnosis not present

## 2021-02-04 DIAGNOSIS — M9903 Segmental and somatic dysfunction of lumbar region: Secondary | ICD-10-CM | POA: Diagnosis not present

## 2021-02-04 DIAGNOSIS — M5431 Sciatica, right side: Secondary | ICD-10-CM | POA: Diagnosis not present

## 2021-02-11 DIAGNOSIS — M5431 Sciatica, right side: Secondary | ICD-10-CM | POA: Diagnosis not present

## 2021-02-11 DIAGNOSIS — M9903 Segmental and somatic dysfunction of lumbar region: Secondary | ICD-10-CM | POA: Diagnosis not present

## 2021-02-11 DIAGNOSIS — M9902 Segmental and somatic dysfunction of thoracic region: Secondary | ICD-10-CM | POA: Diagnosis not present

## 2021-02-11 DIAGNOSIS — M25551 Pain in right hip: Secondary | ICD-10-CM | POA: Diagnosis not present

## 2021-02-11 DIAGNOSIS — M542 Cervicalgia: Secondary | ICD-10-CM | POA: Diagnosis not present

## 2021-02-11 DIAGNOSIS — M9901 Segmental and somatic dysfunction of cervical region: Secondary | ICD-10-CM | POA: Diagnosis not present

## 2021-02-18 DIAGNOSIS — M9903 Segmental and somatic dysfunction of lumbar region: Secondary | ICD-10-CM | POA: Diagnosis not present

## 2021-02-18 DIAGNOSIS — M9902 Segmental and somatic dysfunction of thoracic region: Secondary | ICD-10-CM | POA: Diagnosis not present

## 2021-02-18 DIAGNOSIS — M5431 Sciatica, right side: Secondary | ICD-10-CM | POA: Diagnosis not present

## 2021-02-18 DIAGNOSIS — M9901 Segmental and somatic dysfunction of cervical region: Secondary | ICD-10-CM | POA: Diagnosis not present

## 2021-02-18 DIAGNOSIS — M25551 Pain in right hip: Secondary | ICD-10-CM | POA: Diagnosis not present

## 2021-02-18 DIAGNOSIS — M542 Cervicalgia: Secondary | ICD-10-CM | POA: Diagnosis not present

## 2021-02-25 DIAGNOSIS — M9901 Segmental and somatic dysfunction of cervical region: Secondary | ICD-10-CM | POA: Diagnosis not present

## 2021-02-25 DIAGNOSIS — M5431 Sciatica, right side: Secondary | ICD-10-CM | POA: Diagnosis not present

## 2021-02-25 DIAGNOSIS — M542 Cervicalgia: Secondary | ICD-10-CM | POA: Diagnosis not present

## 2021-02-25 DIAGNOSIS — M9903 Segmental and somatic dysfunction of lumbar region: Secondary | ICD-10-CM | POA: Diagnosis not present

## 2021-02-25 DIAGNOSIS — M9902 Segmental and somatic dysfunction of thoracic region: Secondary | ICD-10-CM | POA: Diagnosis not present

## 2021-02-25 DIAGNOSIS — M25551 Pain in right hip: Secondary | ICD-10-CM | POA: Diagnosis not present

## 2021-03-11 DIAGNOSIS — M9901 Segmental and somatic dysfunction of cervical region: Secondary | ICD-10-CM | POA: Diagnosis not present

## 2021-03-11 DIAGNOSIS — M542 Cervicalgia: Secondary | ICD-10-CM | POA: Diagnosis not present

## 2021-03-11 DIAGNOSIS — M5431 Sciatica, right side: Secondary | ICD-10-CM | POA: Diagnosis not present

## 2021-03-11 DIAGNOSIS — M25551 Pain in right hip: Secondary | ICD-10-CM | POA: Diagnosis not present

## 2021-03-11 DIAGNOSIS — M9902 Segmental and somatic dysfunction of thoracic region: Secondary | ICD-10-CM | POA: Diagnosis not present

## 2021-03-11 DIAGNOSIS — M9903 Segmental and somatic dysfunction of lumbar region: Secondary | ICD-10-CM | POA: Diagnosis not present

## 2021-03-21 DIAGNOSIS — M5431 Sciatica, right side: Secondary | ICD-10-CM | POA: Diagnosis not present

## 2021-03-21 DIAGNOSIS — M9902 Segmental and somatic dysfunction of thoracic region: Secondary | ICD-10-CM | POA: Diagnosis not present

## 2021-03-21 DIAGNOSIS — M25551 Pain in right hip: Secondary | ICD-10-CM | POA: Diagnosis not present

## 2021-03-21 DIAGNOSIS — M9901 Segmental and somatic dysfunction of cervical region: Secondary | ICD-10-CM | POA: Diagnosis not present

## 2021-03-21 DIAGNOSIS — M9903 Segmental and somatic dysfunction of lumbar region: Secondary | ICD-10-CM | POA: Diagnosis not present

## 2021-03-21 DIAGNOSIS — M542 Cervicalgia: Secondary | ICD-10-CM | POA: Diagnosis not present

## 2021-04-01 DIAGNOSIS — M9902 Segmental and somatic dysfunction of thoracic region: Secondary | ICD-10-CM | POA: Diagnosis not present

## 2021-04-01 DIAGNOSIS — M25551 Pain in right hip: Secondary | ICD-10-CM | POA: Diagnosis not present

## 2021-04-01 DIAGNOSIS — M9901 Segmental and somatic dysfunction of cervical region: Secondary | ICD-10-CM | POA: Diagnosis not present

## 2021-04-01 DIAGNOSIS — M5431 Sciatica, right side: Secondary | ICD-10-CM | POA: Diagnosis not present

## 2021-04-01 DIAGNOSIS — M9903 Segmental and somatic dysfunction of lumbar region: Secondary | ICD-10-CM | POA: Diagnosis not present

## 2021-04-01 DIAGNOSIS — M542 Cervicalgia: Secondary | ICD-10-CM | POA: Diagnosis not present

## 2021-04-15 DIAGNOSIS — R5081 Fever presenting with conditions classified elsewhere: Secondary | ICD-10-CM | POA: Diagnosis not present

## 2021-04-16 DIAGNOSIS — R509 Fever, unspecified: Secondary | ICD-10-CM | POA: Diagnosis not present

## 2021-04-17 DIAGNOSIS — R509 Fever, unspecified: Secondary | ICD-10-CM | POA: Diagnosis not present

## 2021-04-22 ENCOUNTER — Other Ambulatory Visit: Payer: Self-pay

## 2021-04-22 ENCOUNTER — Emergency Department (HOSPITAL_COMMUNITY): Payer: Medicare Other

## 2021-04-22 ENCOUNTER — Inpatient Hospital Stay (HOSPITAL_COMMUNITY)
Admission: EM | Admit: 2021-04-22 | Discharge: 2021-04-26 | DRG: 864 | Disposition: A | Discharge status: Home / Self Care | Payer: Medicare Other | Source: Ambulatory Visit | Attending: Internal Medicine | Admitting: Internal Medicine

## 2021-04-22 ENCOUNTER — Encounter (HOSPITAL_COMMUNITY): Payer: Self-pay | Admitting: Emergency Medicine

## 2021-04-22 DIAGNOSIS — I3139 Other pericardial effusion (noninflammatory): Secondary | ICD-10-CM | POA: Diagnosis not present

## 2021-04-22 DIAGNOSIS — Z0389 Encounter for observation for other suspected diseases and conditions ruled out: Secondary | ICD-10-CM | POA: Diagnosis not present

## 2021-04-22 DIAGNOSIS — Z9049 Acquired absence of other specified parts of digestive tract: Secondary | ICD-10-CM

## 2021-04-22 DIAGNOSIS — M25551 Pain in right hip: Secondary | ICD-10-CM | POA: Diagnosis not present

## 2021-04-22 DIAGNOSIS — Z9882 Breast implant status: Secondary | ICD-10-CM | POA: Diagnosis not present

## 2021-04-22 DIAGNOSIS — N261 Atrophy of kidney (terminal): Secondary | ICD-10-CM | POA: Diagnosis not present

## 2021-04-22 DIAGNOSIS — M9903 Segmental and somatic dysfunction of lumbar region: Secondary | ICD-10-CM | POA: Diagnosis not present

## 2021-04-22 DIAGNOSIS — R509 Fever, unspecified: Secondary | ICD-10-CM | POA: Diagnosis not present

## 2021-04-22 DIAGNOSIS — D509 Iron deficiency anemia, unspecified: Secondary | ICD-10-CM | POA: Diagnosis present

## 2021-04-22 DIAGNOSIS — Z28310 Unvaccinated for covid-19: Secondary | ICD-10-CM

## 2021-04-22 DIAGNOSIS — D75839 Thrombocytosis, unspecified: Secondary | ICD-10-CM | POA: Diagnosis present

## 2021-04-22 DIAGNOSIS — N2881 Hypertrophy of kidney: Secondary | ICD-10-CM | POA: Diagnosis not present

## 2021-04-22 DIAGNOSIS — N280 Ischemia and infarction of kidney: Secondary | ICD-10-CM | POA: Diagnosis not present

## 2021-04-22 DIAGNOSIS — I10 Essential (primary) hypertension: Secondary | ICD-10-CM | POA: Diagnosis present

## 2021-04-22 DIAGNOSIS — Z20822 Contact with and (suspected) exposure to covid-19: Secondary | ICD-10-CM | POA: Diagnosis present

## 2021-04-22 DIAGNOSIS — Z681 Body mass index (BMI) 19 or less, adult: Secondary | ICD-10-CM

## 2021-04-22 DIAGNOSIS — Z882 Allergy status to sulfonamides status: Secondary | ICD-10-CM

## 2021-04-22 DIAGNOSIS — I340 Nonrheumatic mitral (valve) insufficiency: Secondary | ICD-10-CM | POA: Diagnosis not present

## 2021-04-22 DIAGNOSIS — M4316 Spondylolisthesis, lumbar region: Secondary | ICD-10-CM | POA: Diagnosis not present

## 2021-04-22 DIAGNOSIS — M9901 Segmental and somatic dysfunction of cervical region: Secondary | ICD-10-CM | POA: Diagnosis not present

## 2021-04-22 DIAGNOSIS — K089 Disorder of teeth and supporting structures, unspecified: Secondary | ICD-10-CM | POA: Diagnosis not present

## 2021-04-22 DIAGNOSIS — N2889 Other specified disorders of kidney and ureter: Secondary | ICD-10-CM | POA: Diagnosis not present

## 2021-04-22 DIAGNOSIS — Z66 Do not resuscitate: Secondary | ICD-10-CM | POA: Diagnosis present

## 2021-04-22 DIAGNOSIS — Z79899 Other long term (current) drug therapy: Secondary | ICD-10-CM

## 2021-04-22 DIAGNOSIS — R636 Underweight: Secondary | ICD-10-CM | POA: Diagnosis present

## 2021-04-22 DIAGNOSIS — Z90710 Acquired absence of both cervix and uterus: Secondary | ICD-10-CM

## 2021-04-22 DIAGNOSIS — M542 Cervicalgia: Secondary | ICD-10-CM | POA: Diagnosis not present

## 2021-04-22 DIAGNOSIS — I341 Nonrheumatic mitral (valve) prolapse: Secondary | ICD-10-CM | POA: Diagnosis not present

## 2021-04-22 DIAGNOSIS — E8809 Other disorders of plasma-protein metabolism, not elsewhere classified: Secondary | ICD-10-CM | POA: Diagnosis present

## 2021-04-22 DIAGNOSIS — R61 Generalized hyperhidrosis: Secondary | ICD-10-CM | POA: Diagnosis present

## 2021-04-22 DIAGNOSIS — M9902 Segmental and somatic dysfunction of thoracic region: Secondary | ICD-10-CM | POA: Diagnosis not present

## 2021-04-22 DIAGNOSIS — I2510 Atherosclerotic heart disease of native coronary artery without angina pectoris: Secondary | ICD-10-CM | POA: Diagnosis not present

## 2021-04-22 DIAGNOSIS — M5431 Sciatica, right side: Secondary | ICD-10-CM | POA: Diagnosis not present

## 2021-04-22 DIAGNOSIS — D649 Anemia, unspecified: Secondary | ICD-10-CM

## 2021-04-22 DIAGNOSIS — D4101 Neoplasm of uncertain behavior of right kidney: Secondary | ICD-10-CM | POA: Diagnosis not present

## 2021-04-22 DIAGNOSIS — J439 Emphysema, unspecified: Secondary | ICD-10-CM | POA: Diagnosis not present

## 2021-04-22 LAB — COMPREHENSIVE METABOLIC PANEL
ALT: 32 U/L (ref 0–44)
AST: 22 U/L (ref 15–41)
Albumin: 2.4 g/dL — ABNORMAL LOW (ref 3.5–5.0)
Alkaline Phosphatase: 80 U/L (ref 38–126)
Anion gap: 10 (ref 5–15)
BUN: 19 mg/dL (ref 8–23)
CO2: 23 mmol/L (ref 22–32)
Calcium: 9.2 mg/dL (ref 8.9–10.3)
Chloride: 102 mmol/L (ref 98–111)
Creatinine, Ser: 0.75 mg/dL (ref 0.44–1.00)
GFR, Estimated: >60 mL/min (ref >60)
Glucose, Bld: 114 mg/dL — ABNORMAL HIGH (ref 70–99)
Potassium: 3.9 mmol/L (ref 3.5–5.1)
Sodium: 135 mmol/L (ref 135–145)
Total Bilirubin: 0.3 mg/dL (ref 0.3–1.2)
Total Protein: 8 g/dL (ref 6.5–8.1)

## 2021-04-22 LAB — CBC WITH DIFFERENTIAL/PLATELET
Abs Immature Granulocytes: 0.03 10*3/uL (ref 0.00–0.07)
Basophils Absolute: 0.1 10*3/uL (ref 0.0–0.1)
Basophils Relative: 1 %
Eosinophils Absolute: 0 10*3/uL (ref 0.0–0.5)
Eosinophils Relative: 1 %
HCT: 25.4 % — ABNORMAL LOW (ref 36.0–46.0)
Hemoglobin: 7.6 g/dL — ABNORMAL LOW (ref 12.0–15.0)
Immature Granulocytes: 0 %
Lymphocytes Relative: 13 %
Lymphs Abs: 1 10*3/uL (ref 0.7–4.0)
MCH: 25.2 pg — ABNORMAL LOW (ref 26.0–34.0)
MCHC: 29.9 g/dL — ABNORMAL LOW (ref 30.0–36.0)
MCV: 84.1 fL (ref 80.0–100.0)
Monocytes Absolute: 0.6 10*3/uL (ref 0.1–1.0)
Monocytes Relative: 8 %
Neutro Abs: 6.1 10*3/uL (ref 1.7–7.7)
Neutrophils Relative %: 77 %
Platelets: 651 10*3/uL — ABNORMAL HIGH (ref 150–400)
RBC: 3.02 MIL/uL — ABNORMAL LOW (ref 3.87–5.11)
RDW: 15.8 % — ABNORMAL HIGH (ref 11.5–15.5)
WBC: 7.9 10*3/uL (ref 4.0–10.5)
nRBC: 0 % (ref 0.0–0.2)

## 2021-04-22 LAB — URINALYSIS, ROUTINE W REFLEX MICROSCOPIC
Bacteria, UA: NONE SEEN
Bilirubin Urine: NEGATIVE
Glucose, UA: NEGATIVE mg/dL
Ketones, ur: NEGATIVE mg/dL
Nitrite: NEGATIVE
Protein, ur: 30 mg/dL — AB
Specific Gravity, Urine: 1.013 (ref 1.005–1.030)
pH: 5 (ref 5.0–8.0)

## 2021-04-22 LAB — LACTIC ACID, PLASMA: Lactic Acid, Venous: 0.7 mmol/L (ref 0.5–1.9)

## 2021-04-22 LAB — PROTIME-INR
INR: 1.3 — ABNORMAL HIGH (ref 0.8–1.2)
Prothrombin Time: 15.8 seconds — ABNORMAL HIGH (ref 11.4–15.2)

## 2021-04-22 LAB — APTT: aPTT: 36 seconds (ref 24–36)

## 2021-04-22 NOTE — ED Triage Notes (Signed)
Pt states she had a dental procedure a month ago and she is been running fever since then, having palpitations and feeling more fatigue. Pt was send by PCP to be check. ?

## 2021-04-22 NOTE — ED Provider Triage Note (Signed)
Emergency Medicine Provider Triage Evaluation Note ? ?Tricia Mendoza , a 71 y.o. female  was evaluated in triage.  Pt complains of fever.  Patient has had persistent fever since dental surgery approximately 1 month prior.  Patient has been on a course of amoxicillin and doxycycline with no improvement in fever.  Patient was sent to the emergency department for further evaluation by her PCP Dr. [name] ? ?I spoke with Dr. [name] she reported that patient had elevated sed rate at 46.  Was noted to have anemia with hemoglobin at 8.  He expressed concern for possible GI malignancy versus endocarditis. ? ?Review of Systems  ?Positive: Fever ?Negative: Chills, nausea, vomiting, abdominal pain, blood in stool, melena, diarrhea, cough, dysuria, hematuria, urinary urgency. ? ?Physical Exam  ?BP (!) 157/73 (BP Location: Left Arm)   Pulse (!) 112   Temp 100.2 ?F (37.9 ?C) (Oral)   Resp 17   Ht '5\' 1"'$  (1.549 m)   Wt 44 kg   SpO2 99%   BMI 18.33 kg/m?  ?Gen:   Awake, no distress   ?Resp:  Normal effort, to auscultation bilaterally ?MSK:   Moves extremities without difficulty  ?Other:  Abdomen soft, nondistended, nontender no guarding rebound tenderness.  S1, S2 present with no murmurs rubs or gallops. ? ?Medical Decision Making  ?Medically screening exam initiated at 9:01 PM.  Appropriate orders placed.  [name] was informed that the remainder of the evaluation will be completed by another provider, this initial triage assessment does not replace that evaluation, and the importance of remaining in the ED until their evaluation is complete. ? ?Sepsis work-up initiated ?  ?[name], PA-C ?04/22/21 2106 ? ?

## 2021-04-23 ENCOUNTER — Observation Stay (HOSPITAL_COMMUNITY): Payer: Medicare Other

## 2021-04-23 ENCOUNTER — Encounter (HOSPITAL_COMMUNITY): Payer: Self-pay | Admitting: Emergency Medicine

## 2021-04-23 ENCOUNTER — Observation Stay (HOSPITAL_BASED_OUTPATIENT_CLINIC_OR_DEPARTMENT_OTHER): Payer: Medicare Other

## 2021-04-23 DIAGNOSIS — N2889 Other specified disorders of kidney and ureter: Secondary | ICD-10-CM | POA: Diagnosis not present

## 2021-04-23 DIAGNOSIS — I2510 Atherosclerotic heart disease of native coronary artery without angina pectoris: Secondary | ICD-10-CM | POA: Diagnosis not present

## 2021-04-23 DIAGNOSIS — R509 Fever, unspecified: Secondary | ICD-10-CM

## 2021-04-23 DIAGNOSIS — N280 Ischemia and infarction of kidney: Secondary | ICD-10-CM | POA: Diagnosis not present

## 2021-04-23 DIAGNOSIS — D649 Anemia, unspecified: Secondary | ICD-10-CM | POA: Diagnosis not present

## 2021-04-23 DIAGNOSIS — D4101 Neoplasm of uncertain behavior of right kidney: Secondary | ICD-10-CM | POA: Diagnosis not present

## 2021-04-23 DIAGNOSIS — N2881 Hypertrophy of kidney: Secondary | ICD-10-CM | POA: Diagnosis not present

## 2021-04-23 DIAGNOSIS — N261 Atrophy of kidney (terminal): Secondary | ICD-10-CM

## 2021-04-23 DIAGNOSIS — M4316 Spondylolisthesis, lumbar region: Secondary | ICD-10-CM | POA: Diagnosis not present

## 2021-04-23 DIAGNOSIS — J439 Emphysema, unspecified: Secondary | ICD-10-CM | POA: Diagnosis not present

## 2021-04-23 LAB — FOLATE: Folate: 34.8 ng/mL (ref >5.9)

## 2021-04-23 LAB — RETIC PANEL
Immature Retic Fract: 15 % (ref 2.3–15.9)
RBC.: 2.86 MIL/uL — ABNORMAL LOW (ref 3.87–5.11)
Retic Count, Absolute: 38.9 10*3/uL (ref 19.0–186.0)
Retic Ct Pct: 1.4 % (ref 0.4–3.1)
Reticulocyte Hemoglobin: 24.2 pg — ABNORMAL LOW (ref >27.9)

## 2021-04-23 LAB — IRON AND TIBC
Iron: 14 ug/dL — ABNORMAL LOW (ref 28–170)
Saturation Ratios: 9 % — ABNORMAL LOW (ref 10.4–31.8)
TIBC: 150 ug/dL — ABNORMAL LOW (ref 250–450)
UIBC: 136 ug/dL

## 2021-04-23 LAB — RESP PANEL BY RT-PCR (FLU A&B, COVID) ARPGX2
Influenza A by PCR: NEGATIVE
Influenza B by PCR: NEGATIVE
SARS Coronavirus 2 by RT PCR: NEGATIVE

## 2021-04-23 LAB — C-REACTIVE PROTEIN: CRP: 22.1 mg/dL — ABNORMAL HIGH (ref <1.0)

## 2021-04-23 LAB — TECHNOLOGIST SMEAR REVIEW: Plt Morphology: NORMAL

## 2021-04-23 LAB — SEDIMENTATION RATE: Sed Rate: 145 mm/hr — ABNORMAL HIGH (ref 0–22)

## 2021-04-23 LAB — ECHOCARDIOGRAM COMPLETE
Area-P 1/2: 5.38 cm2
Calc EF: 56.8 %
Height: 61 in
MV VTI: 2.42 cm2
S' Lateral: 2.7 cm
Single Plane A2C EF: 55.7 %
Single Plane A4C EF: 58.8 %
Weight: 1552 oz

## 2021-04-23 LAB — FERRITIN: Ferritin: 441 ng/mL — ABNORMAL HIGH (ref 11–307)

## 2021-04-23 LAB — LACTATE DEHYDROGENASE: LDH: 97 U/L — ABNORMAL LOW (ref 98–192)

## 2021-04-23 LAB — TSH: TSH: 2.114 u[IU]/mL (ref 0.350–4.500)

## 2021-04-23 LAB — POC OCCULT BLOOD, ED: Fecal Occult Bld: NEGATIVE

## 2021-04-23 LAB — SAVE SMEAR(SSMR), FOR PROVIDER SLIDE REVIEW

## 2021-04-23 LAB — HIV ANTIBODY (ROUTINE TESTING W REFLEX): HIV Screen 4th Generation wRfx: NONREACTIVE

## 2021-04-23 LAB — CK: Total CK: 13 U/L — ABNORMAL LOW (ref 38–234)

## 2021-04-23 LAB — VITAMIN B12: Vitamin B-12: 1087 pg/mL — ABNORMAL HIGH (ref 180–914)

## 2021-04-23 MED ORDER — VANCOMYCIN HCL 750 MG/150ML IV SOLN: 750.0000 mg | INTRAVENOUS | Status: DC

## 2021-04-23 MED ORDER — IOHEXOL 300 MG/ML  SOLN
100.0000 mL | Freq: Once | INTRAMUSCULAR | Status: AC | PRN
  Administered 2021-04-23: 100 mL via INTRAVENOUS

## 2021-04-23 MED ORDER — ENOXAPARIN SODIUM 30 MG/0.3ML IJ SOSY
30.0000 mg | PREFILLED_SYRINGE | INTRAMUSCULAR | Status: DC
  Administered 2021-04-23 – 2021-04-25 (×3): 30 mg via SUBCUTANEOUS
  Filled 2021-04-23 (×3): qty 0.3

## 2021-04-23 MED ORDER — VANCOMYCIN HCL IN DEXTROSE 1-5 GM/200ML-% IV SOLN
1000.0000 mg | Freq: Once | INTRAVENOUS | Status: AC
  Administered 2021-04-23: 1000 mg via INTRAVENOUS
  Filled 2021-04-23: qty 200

## 2021-04-23 MED ORDER — ACETAMINOPHEN 650 MG RE SUPP
650.0000 mg | Freq: Four times a day (QID) | RECTAL | Status: DC | PRN
Start: 2021-04-23 — End: 2021-04-26
  Filled 2021-04-23: qty 1

## 2021-04-23 MED ORDER — ACETAMINOPHEN 325 MG PO TABS
650.0000 mg | ORAL_TABLET | Freq: Four times a day (QID) | ORAL | Status: DC | PRN
  Administered 2021-04-23 – 2021-04-25 (×3): 650 mg via ORAL
  Filled 2021-04-23 (×4): qty 2

## 2021-04-23 MED ORDER — SODIUM CHLORIDE 0.9 % IV SOLN
2.0000 g | INTRAVENOUS | Status: DC
  Administered 2021-04-23: 2 g via INTRAVENOUS
  Filled 2021-04-23: qty 20

## 2021-04-23 MED ORDER — POLYETHYLENE GLYCOL 3350 17 G PO PACK
17.0000 g | PACK | Freq: Every day | ORAL | Status: DC | PRN
  Administered 2021-04-24 – 2021-04-25 (×2): 17 g via ORAL
  Filled 2021-04-23 (×2): qty 1

## 2021-04-23 NOTE — Consult Note (Signed)
I have been asked to see the patient by Dr. Maudie Mercury, for evaluation and management of right renal mass. ? ?History of present illness: ?71 year old otherwise reasonably healthy female presented to the hospital today as advised by her PCP for evaluation of fever of unknown origin, night sweats, weight loss, and anemia.  This was preceded by dental procedure where she developed an infection.  During her evaluation she underwent a CT scan.  This demonstrated a 5 x 6 cm right renal mass, a hypertrophic right kidney, and an atrophic left kidney.  I was consulted for further evaluation.  The patient denies any abdominal pain.  She denies any gross hematuria. ? ?The patient was noted to be anemic with a hemoglobin of 7.  Her renal function has remained stable. ? ?The patient is otherwise very healthy, very active, works as a Community education officer. ? ?Review of systems: A 12 point comprehensive review of systems was obtained and is negative unless otherwise stated in the history of present illness. ? ?Patient Active Problem List  ? Diagnosis Date Noted  ? Fever in adult 04/23/2021  ? Right kidney mass 04/23/2021  ? 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 Anemia   ? Special screening for malignant neoplasms, colon 12/17/2017  ? ? ?No current facility-administered medications on file prior to encounter.  ? ?Current Outpatient Medications on File Prior to Encounter  ?Medication Sig Dispense Refill  ? acetaminophen (TYLENOL) 500 MG tablet Take 1,000 mg by mouth every 6 (six) hours as needed for mild pain or fever.    ? b complex vitamins capsule Take 1 capsule by mouth daily.    ? Coenzyme Q10 (COQ10) 100 MG CAPS Take 100 mg by mouth daily.    ? MAGNESIUM CITRATE PO Take 750 mg by mouth daily.    ? metoprolol succinate (TOPROL-XL) 25 MG 24 hr tablet Take 25 mg by mouth daily.    ? Multiple Vitamin (MULTIVITAMIN) tablet Take 1 tablet by mouth daily.    ? amLODipine (NORVASC) 5 MG tablet Take 5 mg by mouth daily. (Patient not  taking: Reported on 04/23/2021)    ? ? ?Past Medical History:  ?Diagnosis Date  ? Complication of anesthesia   ? nausea  ? Headache   ? Heart murmur   ? ? ?Past Surgical History:  ?Procedure Laterality Date  ? ABDOMINAL HYSTERECTOMY    ? partial  ? CHOLECYSTECTOMY    ? COLONOSCOPY N/A 04/14/2018  ? Procedure: COLONOSCOPY;  Surgeon: Rogene Houston, MD;  Location: [facility];  Service: Endoscopy;  Laterality: N/A;  730  ? ? ?Social History  ? ?Tobacco Use  ? Smoking status: Never  ? Smokeless tobacco: Never  ?Vaping Use  ? Vaping Use: Never used  ?Substance Use Topics  ? Alcohol use: Never  ? Drug use: Never  ? ? ?History reviewed. No pertinent family history. ? ?PE: ?Vitals:  ? 04/23/21 1800 04/23/21 1843 04/23/21 1900 04/23/21 1930  ?BP: 134/75 134/75 (!) 146/76 (!) 148/69  ?Pulse: (!) 112 (!) 111 (!) 110 (!) 110  ?Resp:  15  18  ?Temp:      ?TempSrc:      ?SpO2: 100% 99% 100% 100%  ?Weight:      ?Height:      ? ?Patient appears to be in no acute distress  ?patient is alert and oriented x3 ?Atraumatic normocephalic head ?No cervical or supraclavicular lymphadenopathy appreciated ?No increased work of breathing, no audible wheezes/rhonchi ?Regular sinus rhythm/rate ?Abdomen is  soft, nontender, nondistended, no CVA or suprapubic tenderness ?Lower extremities are symmetric without appreciable edema ?Grossly neurologically intact ?No identifiable skin lesions ? ?Recent Labs  ?  04/22/21 ?2117  ?WBC 7.9  ?HGB 7.6*  ?HCT 25.4*  ? ?Recent Labs  ?  04/22/21 ?2117  ?NA 135  ?K 3.9  ?CL 102  ?CO2 23  ?GLUCOSE 114*  ?BUN 19  ?CREATININE 0.75  ?CALCIUM 9.2  ? ?Recent Labs  ?  04/22/21 ?2117  ?INR 1.3*  ? ?No results for input(s): LABURIN in the last 72 hours. ?Results for orders placed or performed during the hospital encounter of 04/22/21  ?Blood Culture (routine x 2)     Status: None (Preliminary result)  ? Collection Time: 04/22/21  9:18 PM  ? Specimen: BLOOD RIGHT ARM  ?Result Value Ref Range Status  ? Specimen  Description BLOOD RIGHT ARM  Final  ? Special Requests   Final  ?  BOTTLES DRAWN AEROBIC AND ANAEROBIC Blood Culture results may not be optimal due to an excessive volume of blood received in culture bottles  ? Culture   Final  ?  NO GROWTH < 12 HOURS ?Performed at [facility], [place] [address], [place], [place] [address] ?  ? Report Status PENDING  Incomplete  ?Blood Culture (routine x 2)     Status: None (Preliminary result)  ? Collection Time: 04/22/21  9:18 PM  ? Specimen: BLOOD LEFT ARM  ?Result Value Ref Range Status  ? Specimen Description BLOOD LEFT ARM  Final  ? Special Requests   Final  ?  AEROBIC BOTTLE ONLY Blood Culture results may not be optimal due to an excessive volume of blood received in culture bottles  ? Culture   Final  ?  NO GROWTH < 12 HOURS ?Performed at [facility], [place] [address], [place], [place] [address] ?  ? Report Status PENDING  Incomplete  ?Resp Panel by RT-PCR (Flu A&B, Covid) Nasopharyngeal Swab     Status: None  ? Collection Time: 04/23/21  8:39 AM  ? Specimen: Nasopharyngeal Swab; Nasopharyngeal(NP) swabs in vial transport medium  ?Result Value Ref Range Status  ? SARS Coronavirus 2 by RT PCR NEGATIVE NEGATIVE Final  ?  Comment: (NOTE) ?SARS-CoV-2 target nucleic acids are NOT DETECTED. ? ?The SARS-CoV-2 RNA is generally detectable in upper respiratory ?specimens during the acute phase of infection. The lowest ?concentration of SARS-CoV-2 viral copies this assay can detect is ?138 copies/mL. A negative result does not preclude SARS-Cov-2 ?infection and should not be used as the sole basis for treatment or ?other patient management decisions. A negative result may occur with  ?improper specimen collection/handling, submission of specimen other ?than nasopharyngeal swab, presence of viral mutation(s) within the ?areas targeted by this assay, and inadequate number of viral ?copies(<138 copies/mL). A negative result must be combined with ?clinical observations, patient  history, and epidemiological ?information. The expected result is Negative. ? ?Fact Sheet for Patients:  ?EntrepreneurPulse.com.au ? ?Fact Sheet for Healthcare Providers:  ?IncredibleEmployment.be ? ?This test is no t yet approved or cleared by the [place] FDA and  ?has been authorized for detection and/or diagnosis of SARS-CoV-2 by ?FDA under an Emergency Use Authorization (EUA). This EUA will remain  ?in effect (meaning this test can be used) for the duration of the ?COVID-19 declaration under Section 564(b)(1) of the Act, 21 ?U.S.C.section 360bbb-3(b)(1), unless the authorization is terminated  ?or revoked sooner.  ? ? ?  ? Influenza A by PCR  NEGATIVE NEGATIVE Final  ? Influenza B by PCR NEGATIVE NEGATIVE Final  ?  Comment: (NOTE) ?The Xpert Xpress SARS-CoV-2/FLU/RSV plus assay is intended as an aid ?in the diagnosis of influenza from Nasopharyngeal swab specimens and ?should not be used as a sole basis for treatment. Nasal washings and ?aspirates are unacceptable for Xpert Xpress SARS-CoV-2/FLU/RSV ?testing. ? ?Fact Sheet for Patients: ?EntrepreneurPulse.com.au ? ?Fact Sheet for Healthcare Providers: ?IncredibleEmployment.be ? ?This test is not yet approved or cleared by the [place] FDA and ?has been authorized for detection and/or diagnosis of SARS-CoV-2 by ?FDA under an Emergency Use Authorization (EUA). This EUA will remain ?in effect (meaning this test can be used) for the duration of the ?COVID-19 declaration under Section 564(b)(1) of the Act, 21 U.S.C. ?section 360bbb-3(b)(1), unless the authorization is terminated or ?revoked. ? ?Performed at [facility], [place] [address] [place], [place] ?[address] ?  ? ? ?Imaging: ?I reviewed the patient's CT scan and discussed the findings with her.  My interpretation of it is as noted in the HPI. ? ?Imp: Large right renal mass, hypertrophic right kidney, congenital atrophic left  kidney.  Fever unknown origin, unlikely associated with the renal mass, this is probably been there for several years.  There is no evidence of any metastatic disease. ? ?Recommendations: Spoke with the patient abo

## 2021-04-23 NOTE — ED Notes (Signed)
Patient transported to CT 

## 2021-04-23 NOTE — Progress Notes (Signed)
Pharmacy Antibiotic Note ? ?Tricia Mendoza is a 71 y.o. female admitted on 04/22/2021 with  possible endocarditis .  Pharmacy has been consulted for vancomycin dosing. Ceftriaxone has been ordered per MD. ? ?Patient reported a recent dental procedure 1 month ago, after which fatigue, tahcycardia, fever, and night sweats have gradually worsened. Patient currently has HR of 101, BP of 138/73, and RR of 18. WBC is normal at 7.9. Patient is currently afebrile at 99.24F. Echocardiogram completed, will follow results. ? ?Plan: ?Give vancomycin 1g load ?Start vancomycin at 750 mg every 24 hours for [facility] of 515 using Scr 0.8 ?Follow up clinical progress and cultures ?Deescalate as indicated  ?Levels as steady state ? ?Height: '5\' 1"'$  (154.9 cm) ?Weight: 44 kg (97 lb) ?IBW/kg (Calculated) : 47.8 ? ?Temp (24hrs), Avg:99.9 ?F (37.7 ?C), Min:99.1 ?F (37.3 ?C), Max:100.4 ?F (38 ?C) ? ?Recent Labs  ?Lab 04/22/21 ?2117  ?WBC 7.9  ?CREATININE 0.75  ?LATICACIDVEN 0.7  ?  ?Estimated Creatinine Clearance: 46.1 mL/min (by C-G formula based on SCr of 0.75 mg/dL).   ? ?Allergies  ?Allergen Reactions  ? Sulfa Antibiotics Hives and Itching  ? ? ?Antimicrobials this admission: ?Ceftriaxone 3/28 >>  ?Vancomycin 3/28 >>  ? ?Dose adjustments this admission: ?none ? ?Microbiology results: ?3/27 BCx: NG <12 h ?3/28 BCx: IP  ? ? ?Thank you for allowing pharmacy to participate in this patient's care. ? ?Reatha Harps, PharmD ?PGY1 Pharmacy Resident ?04/23/2021 3:06 PM ?Check [email or web address] for unit specific pharmacy number ? ? ?

## 2021-04-23 NOTE — H&P (Signed)
? ?Date: 04/23/2021     ?Patient Name:  Tricia Mendoza MRN: [id number]  ?DOB: 1950/07/17 Age / Sex: 71 y.o., female   ?PCP: Neale Burly, MD    ?     ?Medical Service: Internal Medicine Teaching Service  ?     ?Attending Physician: Dr. Heber [place], Rachel Moulds, DO    ?Intern (1st Contact): Buddy Duty, DO Pager: RA [phone]  ?Resident (2nd Contact): Linwood Dibbles, MD Pager: [place] [phone]  ?     ?After Hours (After 5p/  First Contact Pager: [phone]  ?weekends / holidays): Second Contact Pager: [phone]  ? ?SUBJECTIVE:  ?Chief Complaint: Daily fevers, fatigue, palpitations ? ?History of Present Illness: Tricia Mendoza is a functionally independent 71 y.o. female with a pertinent PMH of hypertension, who presents to [facility] with daily fevers and fatigue. ? ?Tricia Mendoza states that she was in her usual state of health until a month ago after having a dental manipulation procedure.  The day after her dental procedure, she had complaints for fevers that were reaching 103F, for which her dentist prescribed her amoxicillin 500 mg twice daily for 1 week in duration, which did not help her fevers.  After week she called her primary care provider who prescribed her doxycycline twice daily for 5 days.  She states that this helped with high degree fever, but she continued to have daily fevers.  ? ?These fevers typically occur later in the afternoon, and are typically 100.4 to 100.5 ?F. Tylenol does alleviate the fevers. She denies any headaches, nausea, vomiting, chest pain, dyspnea, abdominal pain, diarrhea, constipation, or dysuria.  She does endorse progressive fatigue, palpitations with heart rates averaging 105 to 120 bpm (on home monitor).  She does additionally endorse nightly "drenches" and has to change her outfit approximately 3 times at night.  She states that she had menopause 15 years ago, and these night sweats started from her menopause.  She has not noticed any rashes, or other skin findings.  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  She has had no sick contacts that she is aware. ? ?She presents to [facility] today after seeing her PCP and receiving a call from her primary care physician that her blood levels were low and that she needs to present to to the ED for further evaluation. ? ?ED Course:  ? the patient was patient was noted to have a hemoglobin of 7.6 and thrombocytosis of 650.  Her CMP revealed hypoalbuminemia of 2.4, mild elevation her PT/INR of 15.8/1.3, normal lactic acid of 0.7, urine analysis shows mall amount of hemoglobin, proteinuria, and a small amount of leukocytes.  She was slightly febrile with a temperature of 100.4 ?F. ? ? ?Medications: ?No current facility-administered medications on file prior to encounter.  ? ?Current Outpatient Medications on File Prior to Encounter  ?Medication Sig Dispense Refill  ? acetaminophen (TYLENOL) 500 MG tablet Take 1,000 mg by mouth every 6 (six) hours as needed for mild pain or fever.    ? b complex vitamins capsule Take 1 capsule by mouth daily.    ? Coenzyme Q10 (COQ10) 100 MG CAPS Take 100 mg by mouth daily.    ? MAGNESIUM CITRATE PO Take 750 mg by mouth daily.    ? metoprolol succinate (TOPROL-XL) 25 MG 24 hr tablet Take 25 mg by mouth daily.    ? Multiple Vitamin (  MULTIVITAMIN) tablet Take 1 tablet by mouth daily.    ? amLODipine (NORVASC) 5 MG tablet Take 5 mg by mouth daily. (Patient not taking: Reported on 04/23/2021)    ? ? ?Past Medical History: ?Past Medical History:  ?Diagnosis Date  ? Complication of anesthesia   ? nausea  ? Headache   ? Heart murmur   ? ? ?Social:  ?Lives - [address] ?[place] [address],  ?Support - Good ?Level of function: Independent,walk without assistance ?Occupation -works as a Public relations account executive and PT ?PCP - Stoney Bang A ?Substance use: ?Nonprescription/Illicit -denied ?ETOH -denied ?Tobacco -denied ? ?Family History: ?History reviewed. No pertinent family  history. ?Cancer: Negative ?Hypertension: Negative ?Alcohol abuse: Mother and father ? ?Allergies: ?Allergies as of 04/22/2021 - Review Complete 04/22/2021  ?Allergen Reaction Noted  ? Sulfa antibiotics Hives and Itching 04/07/2018  ? ? ?Review of Systems: ?A complete ROS was negative except as per HPI.  ? ?OBJECTIVE:  ?Physical Exam: ?Blood pressure (!) 146/75, pulse (!) 104, temperature 99.1 ?F (37.3 ?C), temperature source Oral, resp. rate 18, height '5\' 1"'$  (1.549 m), weight 44 kg, SpO2 99 %. ?Physical Exam ?Constitutional:   ?   General: She is not in acute distress. ?   Appearance: Normal appearance. She is not ill-appearing.  ?Eyes:  ?   General: No scleral icterus.    ?   Right eye: No discharge.     ?   Left eye: No discharge.  ?   Conjunctiva/sclera: Conjunctivae normal.  ?   Pupils: Pupils are equal, round, and reactive to light.  ?Cardiovascular:  ?   Rate and Rhythm: Normal rate and regular rhythm.  ?   Pulses: Normal pulses.  ?   Heart sounds: Normal heart sounds. No murmur heard. ?  No friction rub. No gallop.  ?Pulmonary:  ?   Effort: Pulmonary effort is normal. No respiratory distress.  ?   Breath sounds: Normal breath sounds. No wheezing or rales.  ?Abdominal:  ?   General: Abdomen is flat. Bowel sounds are normal.  ?   Palpations: Abdomen is soft.  ?   Tenderness: There is no abdominal tenderness. There is no guarding.  ?Musculoskeletal:  ?   Right lower leg: No edema.  ?   Left lower leg: No edema.  ?Neurological:  ?   General: No focal deficit present.  ?   Mental Status: She is alert and oriented to person, place, and time.  ?Psychiatric:     ?   Mood and Affect: Mood normal.     ?   Behavior: Behavior normal.  ? ? ?Pertinent Labs: ?CBC ?   ?Component Value Date/Time  ? WBC 7.9 04/22/2021 2117  ? RBC 3.02 (L) 04/22/2021 2117  ? HGB 7.6 (L) 04/22/2021 2117  ? HCT 25.4 (L) 04/22/2021 2117  ? PLT 651 (H) 04/22/2021 2117  ? MCV 84.1 04/22/2021 2117  ? MCH 25.2 (L) 04/22/2021 2117  ? MCHC 29.9 (L)  04/22/2021 2117  ? RDW 15.8 (H) 04/22/2021 2117  ? LYMPHSABS 1.0 04/22/2021 2117  ? MONOABS 0.6 04/22/2021 2117  ? EOSABS 0.0 04/22/2021 2117  ? BASOSABS 0.1 04/22/2021 2117  ?  ? ?CMP  ?   ?Component Value Date/Time  ? NA 135 04/22/2021 2117  ? K 3.9 04/22/2021 2117  ? CL 102 04/22/2021 2117  ? CO2 23 04/22/2021 2117  ? GLUCOSE 114 (H) 04/22/2021 2117  ? BUN 19 04/22/2021 2117  ? CREATININE 0.75 04/22/2021 2117  ?  CALCIUM 9.2 04/22/2021 2117  ? PROT 8.0 04/22/2021 2117  ? ALBUMIN 2.4 (L) 04/22/2021 2117  ? AST 22 04/22/2021 2117  ? ALT 32 04/22/2021 2117  ? ALKPHOS 80 04/22/2021 2117  ? BILITOT 0.3 04/22/2021 2117  ? GFRNONAA >60 04/22/2021 2117  ? ? ?Pertinent Imaging: ?CT CHEST ABDOMEN PELVIS W CONTRAST ? ?Result Date: 04/23/2021 ?CLINICAL DATA:  Persistent fever since dental surgery 1 month ago. EXAM: CT CHEST, ABDOMEN, AND PELVIS WITH CONTRAST TECHNIQUE: Multidetector CT imaging of the chest, abdomen and pelvis was performed following the standard protocol during bolus administration of intravenous contrast. RADIATION DOSE REDUCTION: This exam was performed according to the departmental dose-optimization program which includes automated exposure control, adjustment of the mA and/or kV according to patient size and/or use of iterative reconstruction technique. CONTRAST:  127m OMNIPAQUE IOHEXOL 300 MG/ML  SOLN COMPARISON:  None. FINDINGS: CT CHEST FINDINGS Cardiovascular: The heart is normal in size. No pericardial effusion. The aorta is normal in caliber. No dissection. Scattered coronary artery calcifications. Mediastinum/Nodes: No mediastinal or hilar mass or adenopathy. The esophagus is grossly normal. The thyroid gland is unremarkable. Lungs/Pleura: No acute pulmonary findings. No pulmonary infiltrates pulmonary edema. There are mild emphysematous changes and mild hyperinflation. 5 mm sub solid nodule is noted in the right upper lobe on image number 49/5. This is an indeterminate finding but could represent  an area of inflammation No follow-up recommended. This recommendation follows the consensus statement: Guidelines for Management of Incidental Pulmonary Nodules Detected on CT Images: From the [facility]

## 2021-04-23 NOTE — Progress Notes (Signed)
?  Echocardiogram ?2D Echocardiogram has been performed. ? ?Tricia Mendoza ?04/23/2021, 4:28 PM ?

## 2021-04-23 NOTE — ED Provider Notes (Signed)
?[facility] ?Provider Note ? ? ?CSN: [id number] ?Arrival date & time: 04/22/21  1836 ? ?  ? ?History ? ?Chief Complaint  ?Patient presents with  ? Fever  ? ? ?Tricia Mendoza is a 71 y.o. female who presents to the ED today as advised by PCP for further evaluation.  Patient reports approximately 1 month ago she had a minor dental procedure -sounds similar to root canal.  She states that the next day she woke up with a fever of 103.0.  She called her dentist who prescribed her amoxicillin 500 mg twice daily x1 week which she took.  She states that she persisted to have fevers afterwards.  She states that the fevers are mostly present in the afternoon and will dissipate on their own.  Follow-up with her PCP who had prescribed her doxycycline 200 mg twice daily x5 days.  Again she persisted to have fevers.  She also complains of intermittent headaches, fatigue, shortness of breath, soreness to her tongue, weight loss of approximately 8 to 10 pounds.  She states that she followed up with her PCP last week and she received a call yesterday regarding blood work.  She was told that she was losing blood and needed to come to the ED for further evaluation.  She denies any dark or tarry stools, bright red blood, vaginal bleeding, coughing up blood, vomiting blood, bleeding from gums.  ? ?When patient was medically screened yesterday the provider was able to talk to PCP who reported that her sed rate was 46 and was noted to have new anemia with a hemoglobin of 8.  He expressed concern for possible GI malignancy versus endocarditis.  ? ?The history is provided by the patient and medical records.  ? ?  ? ?Home Medications ?Prior to Admission medications   ?Medication Sig Start Date End Date Taking? Authorizing Provider  ?acetaminophen (TYLENOL) 500 MG tablet Take 1,000 mg by mouth every 6 (six) hours as needed for mild pain or fever.   Yes [provider]  ?b complex vitamins capsule  Take 1 capsule by mouth daily.   Yes [provider]  ?Coenzyme Q10 (COQ10) 100 MG CAPS Take 100 mg by mouth daily.   Yes [provider]  ?MAGNESIUM CITRATE PO Take 750 mg by mouth daily.   Yes [provider]  ?metoprolol succinate (TOPROL-XL) 25 MG 24 hr tablet Take 25 mg by mouth daily. 04/15/21  Yes [provider]  ?Multiple Vitamin (MULTIVITAMIN) tablet Take 1 tablet by mouth daily.   Yes [provider]  ?amLODipine (NORVASC) 5 MG tablet Take 5 mg by mouth daily. ?Patient not taking: Reported on 04/23/2021 03/15/18   [provider]  ?   ? ?Allergies    ?Sulfa antibiotics   ? ?Review of Systems   ?Review of Systems  ?Constitutional:  Positive for fatigue and fever. Negative for chills and diaphoresis.  ?HENT:  Negative for ear pain and sore throat.   ?Eyes:  Negative for visual disturbance.  ?Respiratory:  Positive for shortness of breath. Negative for cough.   ?Cardiovascular:  Negative for chest pain.  ?Gastrointestinal:  Negative for abdominal pain, constipation, diarrhea, nausea and vomiting.  ?Genitourinary:  Negative for dysuria, frequency, hematuria and vaginal bleeding.  ?Musculoskeletal:  Negative for myalgias, neck pain and neck stiffness.  ?Skin:  Negative for rash.  ?Neurological:  Positive for weakness (generalized) and headaches.  ?Hematological:  Does not bruise/bleed easily.  ?All other systems reviewed and  are negative. ? ?Physical Exam ?Updated Vital Signs ?BP 138/71   Pulse 100   Temp 99.1 ?F (37.3 ?C) (Oral)   Resp 18   Ht '5\' 1"'$  (1.549 m)   Wt 44 kg   SpO2 94%   BMI 18.33 kg/m?  ?Physical Exam ?Vitals and nursing note reviewed.  ?Constitutional:   ?   Appearance: She is not ill-appearing or diaphoretic.  ?HENT:  ?   Head: Normocephalic and atraumatic.  ?   Mouth/Throat:  ?   Comments: Mild erythema and edema noted to tongue ?Eyes:  ?   Extraocular Movements: Extraocular movements intact.  ?   Conjunctiva/sclera: Conjunctivae  normal.  ?   Pupils: Pupils are equal, round, and reactive to light.  ?Cardiovascular:  ?   Rate and Rhythm: Normal rate and regular rhythm.  ?   Pulses: Normal pulses.  ?Pulmonary:  ?   Effort: Pulmonary effort is normal.  ?   Breath sounds: Normal breath sounds. No wheezing, rhonchi or rales.  ?Abdominal:  ?   Palpations: Abdomen is soft.  ?   Tenderness: There is no abdominal tenderness. There is no guarding or rebound.  ?Genitourinary: ?   Comments: Chaperone present for GU exam. Small nonthrombosed external hemorrhoid. Light brown stool on gloved finger. Fecal occult negative.  ?Musculoskeletal:  ?   Cervical back: Neck supple. No rigidity.  ?Skin: ?   General: Skin is warm and dry.  ?   Findings: No rash.  ?Neurological:  ?   Mental Status: She is alert.  ?   Comments: Alert and oriented to self, place, time and event.  ? ?Speech is fluent, clear without dysarthria or dysphasia.  ? ?Strength 5/5 in upper/lower extremities   ?Sensation intact in upper/lower extremities  ? ?Normal gait.  ?Negative Romberg. No pronator drift.  ?Normal finger-to-nose and feet tapping.  ?CN I not tested  ?CN II grossly intact visual fields bilaterally. Did not visualize posterior eye.  ?CN III, IV, VI PERRLA and EOMs intact bilaterally  ?CN V Intact sensation to sharp and light touch to the face  ?CN VII facial movements symmetric  ?CN VIII not tested  ?CN IX, X no uvula deviation, symmetric rise of soft palate  ?CN XI 5/5 SCM and trapezius strength bilaterally  ?CN XII Midline tongue protrusion, symmetric L/R movements   ? ? ?ED Results / Procedures / Treatments   ?Labs ?(all labs ordered are listed, but only abnormal results are displayed) ?Labs Reviewed  ?COMPREHENSIVE METABOLIC PANEL - Abnormal; Notable for the following components:  ?    Result Value  ? Glucose, Bld 114 (*)   ? Albumin 2.4 (*)   ? All other components within normal limits  ?CBC WITH DIFFERENTIAL/PLATELET - Abnormal; Notable for the following components:  ? RBC  3.02 (*)   ? Hemoglobin 7.6 (*)   ? HCT 25.4 (*)   ? MCH 25.2 (*)   ? MCHC 29.9 (*)   ? RDW 15.8 (*)   ? Platelets 651 (*)   ? All other components within normal limits  ?PROTIME-INR - Abnormal; Notable for the following components:  ? Prothrombin Time 15.8 (*)   ? INR 1.3 (*)   ? All other components within normal limits  ?URINALYSIS, ROUTINE W REFLEX MICROSCOPIC - Abnormal; Notable for the following components:  ? APPearance HAZY (*)   ? Hgb urine dipstick SMALL (*)   ? Protein, ur 30 (*)   ? Leukocytes,Ua SMALL (*)   ? All  other components within normal limits  ?CULTURE, BLOOD (ROUTINE X 2)  ?CULTURE, BLOOD (ROUTINE X 2)  ?URINE CULTURE  ?RESP PANEL BY RT-PCR (FLU A&B, COVID) ARPGX2  ?LACTIC ACID, PLASMA  ?APTT  ?IRON AND TIBC  ?FERRITIN  ?POC OCCULT BLOOD, ED  ? ? ?EKG ?None ? ?Radiology ?DG Chest Port 1 View ? ?Result Date: 04/22/2021 ?CLINICAL DATA:  Possible sepsis EXAM: PORTABLE CHEST 1 VIEW COMPARISON:  None. FINDINGS: The heart size and mediastinal contours are within normal limits. Both lungs are clear. The visualized skeletal structures are unremarkable. IMPRESSION: No active disease. Electronically Signed   By: Inez Catalina M.D.   On: 04/22/2021 21:33   ? ?Procedures ?Procedures  ? ? ?Medications Ordered in ED ?Medications - No data to display ? ?ED Course/ Medical Decision Making/ A&P ?  ?                        ?Medical Decision Making ?71 year old female who presents to the ED today with complaint of intermittent fevers x1 month, mostly in the afternoons.  Following dental procedure.  Has been on 2 rounds of antibiotics without improvement in fevers.  Vague complaints including mild headache, fatigue, generalized weakness, shortness of breath, soreness to her tongue, and weight loss of approximately 8 to 10 pounds.  Done by PCP last week with findings of anemia with a hemoglobin of 8.0 and sed rate of 42.  Concern for possible GI malignancy versus endocarditis. ? ?On arrival to the ED patient was  febrile at 100.2 and tachycardic at 112.  Remainder vitals unremarkable.  She was medically screened and work-up started while in the waiting room (see below).  ? ?We will add on iron studies at this time plan and fec

## 2021-04-23 NOTE — ED Notes (Signed)
1st lactic normal, 2nd to be discontinued. 

## 2021-04-24 ENCOUNTER — Observation Stay (HOSPITAL_COMMUNITY): Payer: Medicare Other

## 2021-04-24 DIAGNOSIS — N261 Atrophy of kidney (terminal): Secondary | ICD-10-CM | POA: Diagnosis present

## 2021-04-24 DIAGNOSIS — K089 Disorder of teeth and supporting structures, unspecified: Secondary | ICD-10-CM | POA: Diagnosis not present

## 2021-04-24 DIAGNOSIS — Z681 Body mass index (BMI) 19 or less, adult: Secondary | ICD-10-CM | POA: Diagnosis not present

## 2021-04-24 DIAGNOSIS — D75839 Thrombocytosis, unspecified: Secondary | ICD-10-CM | POA: Diagnosis present

## 2021-04-24 DIAGNOSIS — I340 Nonrheumatic mitral (valve) insufficiency: Secondary | ICD-10-CM | POA: Diagnosis not present

## 2021-04-24 DIAGNOSIS — I3139 Other pericardial effusion (noninflammatory): Secondary | ICD-10-CM | POA: Diagnosis not present

## 2021-04-24 DIAGNOSIS — Z28310 Unvaccinated for covid-19: Secondary | ICD-10-CM | POA: Diagnosis not present

## 2021-04-24 DIAGNOSIS — D509 Iron deficiency anemia, unspecified: Secondary | ICD-10-CM | POA: Diagnosis present

## 2021-04-24 DIAGNOSIS — R61 Generalized hyperhidrosis: Secondary | ICD-10-CM | POA: Diagnosis present

## 2021-04-24 DIAGNOSIS — N2889 Other specified disorders of kidney and ureter: Secondary | ICD-10-CM | POA: Diagnosis present

## 2021-04-24 DIAGNOSIS — Z9882 Breast implant status: Secondary | ICD-10-CM | POA: Diagnosis not present

## 2021-04-24 DIAGNOSIS — Z66 Do not resuscitate: Secondary | ICD-10-CM | POA: Diagnosis present

## 2021-04-24 DIAGNOSIS — Z882 Allergy status to sulfonamides status: Secondary | ICD-10-CM | POA: Diagnosis not present

## 2021-04-24 DIAGNOSIS — R636 Underweight: Secondary | ICD-10-CM | POA: Diagnosis present

## 2021-04-24 DIAGNOSIS — R509 Fever, unspecified: Secondary | ICD-10-CM | POA: Diagnosis present

## 2021-04-24 DIAGNOSIS — Z90710 Acquired absence of both cervix and uterus: Secondary | ICD-10-CM | POA: Diagnosis not present

## 2021-04-24 DIAGNOSIS — E8809 Other disorders of plasma-protein metabolism, not elsewhere classified: Secondary | ICD-10-CM | POA: Diagnosis present

## 2021-04-24 DIAGNOSIS — Z79899 Other long term (current) drug therapy: Secondary | ICD-10-CM | POA: Diagnosis not present

## 2021-04-24 DIAGNOSIS — Z9049 Acquired absence of other specified parts of digestive tract: Secondary | ICD-10-CM | POA: Diagnosis not present

## 2021-04-24 DIAGNOSIS — N2881 Hypertrophy of kidney: Secondary | ICD-10-CM | POA: Diagnosis present

## 2021-04-24 DIAGNOSIS — I341 Nonrheumatic mitral (valve) prolapse: Secondary | ICD-10-CM | POA: Diagnosis not present

## 2021-04-24 DIAGNOSIS — I10 Essential (primary) hypertension: Secondary | ICD-10-CM | POA: Diagnosis present

## 2021-04-24 DIAGNOSIS — Z20822 Contact with and (suspected) exposure to covid-19: Secondary | ICD-10-CM | POA: Diagnosis present

## 2021-04-24 LAB — ANA W/REFLEX IF POSITIVE: Anti Nuclear Antibody (ANA): POSITIVE — AB

## 2021-04-24 LAB — CBC
HCT: 22.9 % — ABNORMAL LOW (ref 36.0–46.0)
Hemoglobin: 7.1 g/dL — ABNORMAL LOW (ref 12.0–15.0)
MCH: 25.2 pg — ABNORMAL LOW (ref 26.0–34.0)
MCHC: 31 g/dL (ref 30.0–36.0)
MCV: 81.2 fL (ref 80.0–100.0)
Platelets: 411 10*3/uL — ABNORMAL HIGH (ref 150–400)
RBC: 2.82 MIL/uL — ABNORMAL LOW (ref 3.87–5.11)
RDW: 15.7 % — ABNORMAL HIGH (ref 11.5–15.5)
WBC: 7 10*3/uL (ref 4.0–10.5)
nRBC: 0 % (ref 0.0–0.2)

## 2021-04-24 LAB — ENA+DNA/DS+ANTICH+CENTRO+JO...
Anti JO-1: <0.2 AI (ref 0.0–0.9)
Centromere Ab Screen: <0.2 AI (ref 0.0–0.9)
Chromatin Ab SerPl-aCnc: <0.2 AI (ref 0.0–0.9)
ENA SM Ab Ser-aCnc: <0.2 AI (ref 0.0–0.9)
Ribonucleic Protein: 1.1 AI — ABNORMAL HIGH (ref 0.0–0.9)
SSA (Ro) (ENA) Antibody, IgG: <0.2 AI (ref 0.0–0.9)
SSB (La) (ENA) Antibody, IgG: <0.2 AI (ref 0.0–0.9)
Scleroderma (Scl-70) (ENA) Antibody, IgG: <0.2 AI (ref 0.0–0.9)
ds DNA Ab: 1 IU/mL (ref 0–9)

## 2021-04-24 LAB — BASIC METABOLIC PANEL
Anion gap: 13 (ref 5–15)
BUN: 18 mg/dL (ref 8–23)
CO2: 22 mmol/L (ref 22–32)
Calcium: 8.8 mg/dL — ABNORMAL LOW (ref 8.9–10.3)
Chloride: 100 mmol/L (ref 98–111)
Creatinine, Ser: 0.96 mg/dL (ref 0.44–1.00)
GFR, Estimated: >60 mL/min (ref >60)
Glucose, Bld: 151 mg/dL — ABNORMAL HIGH (ref 70–99)
Potassium: 3.9 mmol/L (ref 3.5–5.1)
Sodium: 135 mmol/L (ref 135–145)

## 2021-04-24 LAB — RHEUMATOID FACTOR: Rheumatoid fact SerPl-aCnc: 15.9 IU/mL — ABNORMAL HIGH (ref <14.0)

## 2021-04-24 MED ORDER — FERROUS SULFATE 325 (65 FE) MG PO TABS
325.0000 mg | ORAL_TABLET | Freq: Every day | ORAL | Status: DC
Start: 2021-04-24 — End: 2021-04-26
  Administered 2021-04-24 – 2021-04-26 (×3): 325 mg via ORAL
  Filled 2021-04-24 (×3): qty 1

## 2021-04-24 MED ORDER — VANCOMYCIN HCL 750 MG/150ML IV SOLN: 750.0000 mg | INTRAVENOUS | Status: DC

## 2021-04-24 NOTE — Progress Notes (Signed)
Mobility Specialist Progress Note: ? ? 04/24/21 1557  ?Mobility  ?Activity Ambulated independently in hallway  ?Level of Assistance Independent  ?Assistive Device None  ?Distance Ambulated (ft) 800 ft  ?Activity Response Tolerated well  ?$Mobility charge 1 Mobility  ? ?Zebulin Siegel ?Mobility Specialist ?Primary Phone [phone] ? ?

## 2021-04-24 NOTE — Plan of Care (Signed)
  Problem: Nutrition: Goal: Adequate nutrition will be maintained Outcome: Progressing   Problem: Coping: Goal: Level of anxiety will decrease Outcome: Progressing   Problem: Pain Managment: Goal: General experience of comfort will improve Outcome: Progressing   

## 2021-04-24 NOTE — Progress Notes (Signed)
? ?HD#1 ?SUBJECTIVE:  ?Patient Summary: Tricia Mendoza is a 71 y.o. with a pertinent PMH of hypertension, who presented with daily fevers and fatigue and admitted for fever of unknown origin likely in the setting of a new large right renal mass.  ? ?Overnight Events: Overnight patient had a fever of 101.5- she received tylenol and fever improved/resolved. ? ?Interim History: This is hospital day 1 for this patient. She states that she feels well this morning. She had another fever overnight and some chills, but the patient states that it has felt the same as she has over the past 6 weeks. She denies any chest pain, abd pain, n/v/d.  ? ?OBJECTIVE:  ?Vital Signs: ?Vitals:  ? 04/23/21 2211 04/23/21 2324 04/24/21 0153 04/24/21 0600  ?BP: (!) 145/70 (!) 144/70 124/62 126/67  ?Pulse: (!) 105 99 81 80  ?Resp: _0 ?Temp: 99.8 ?F (37.7 ?C) (!) 101.5 ?F (38.6 ?C) 98.2 ?F (36.8 ?C) 97.9 ?F (36.6 ?C)  ?TempSrc: Oral Oral Oral Oral  ?SpO2: 100% 100% 100% 100%  ?Weight:      ?Height:      ? ?Supplemental O2: Room Air ?SpO2: 100 % ? ?Filed Weights  ? 04/22/21 2042  ?Weight: 44 kg  ? ? ? ?Intake/Output Summary (Last 24 hours) at 04/24/2021 0726 ?Last data filed at 04/23/2021 1815 ?Gross per 24 hour  ?Intake 200 ml  ?Output --  ?Net 200 ml  ? ?Net IO Since Admission: 200 mL [04/24/21 0726] ? ?Physical Exam: ?General: Pleasant, well-appearing elderly female laying in bed. No acute distress. ?CV: RRR, mid systolic click appreciated at LLSB. No murmurs, rubs, or gallops. No LE edema ?Pulmonary: Lungs CTAB. Normal effort. No wheezing or rales. ?Abdominal: Soft, nontender, nondistended. Normal bowel sounds. ?Extremities: Palpable radial and DP pulses. Normal ROM. ?Skin: Warm and dry.  ?Neuro: A&Ox3. No focal deficit. ?Psych: Normal mood and affect ? ? ? ?ASSESSMENT/PLAN:  ?Assessment: ?Principal Problem: ?  Fever in adult ?Active Problems: ?  Right kidney mass ?  Atrophy of left kidney ? ? ?Plan: ?#Fever, unknown origin ?Fevers  started 1 month ago after having a dental procedure. She was initially prescribed amoxicillin and then doxycycline, however, fevers persisted. TTE showed no evidence of valvular vegetations, however, TEE is recommended to exclude infective endocarditis. No major Duke criteria are met (pending blood cultures) and only 1 minor criteria is met- fevers, making IE not the most likely diagnosis. Of note, blood cultures are showing no growth at 2 days and no growth at 24 hrs (2 different sets). Patient was started on vanc and ceftriaxone, however, we will discontinue these and will repeat cultures when the patient has been off of antibiotics in ~ 2 days.  ?- CT maxillofacial to rule out abscess/infection ?- Blood cultures NGTD ?- TEE scheduled for Friday  ? ?#Right kidney mass, concerning for renal cell carcinoma ?#Atrophic left kidney ?Patient presented with daily fevers x 1 month, fatigue, and drenching night sweats. Patient was found to have a new anemia on labs by her PCP and she was advised to come to the hospital. CT abd/pelvis was obtained and showed a 6 cm right kidney mass concerning for renal cell carcinoma. Urology was consulted and the patient will likely need a renal biopsy and possible a radical nephrectomy. She will also need further testing to determine how well her left kidney is functioning (noted to be atrophic on CT imaging). ?- Outpatient urology follow up ?- Daily BMP ? ?#Normocytic anemia ?#Iron  deficiency anemia ?Patient presented with a new normocytic anemia, Hb 7.6 on admission and down to 7.1 today. Folate and B12 are within normal limits. Ferritin is elevated at 447, although iron is low at 14 and iron sat is low at 9%. Will likely need a colonoscopy as an outpatient.  ?- Start ferrous sulfate 325 mg daily  ?- Daily CBC ? ?#Thrombocytosis ?Patient presented with platelet count of 653, improved to 411 today. This could be reactive given the patient's normocytic anemia and her likely malignancy.  Tech smear review showed normal platelet morphology.  ?- Pathologist smear review in process  ? ?#Hypertension ?SBP in the 120-140s. At home, she was previously on amlodipine 5 mg daily although this was switched to metoprolol 25 mg daily. Patient has been getting headaches from metoprolol and was restarted on her previous amlodipine dose. ?- Amlodipine 5 mg daily  ? ?Best Practice: ?Diet: Regular diet ?IVF: Fluids: none ?VTE: enoxaparin (LOVENOX) injection 30 mg Start: 04/23/21 1100 ?Code: DNR ?AB: Vanc, rocephin ?Therapy Recs: None ?DISPO: Anticipated discharge in 2-3 days to Home pending  medical stability and TEE procedure . ? ?Signature: ?Buddy Duty, D.O.  ?Internal Medicine Resident, PGY-1 ?[facility]  ?Pager: [phone] ?7:26 AM, 04/24/2021  ? ?Please contact the on call pager after 5 pm and on weekends at [phone]. ? ?

## 2021-04-24 NOTE — Plan of Care (Signed)
?  Problem: Nutrition: ?Goal: Adequate nutrition will be maintained ?04/24/2021 0012 by Derrill Kay, RN ?Outcome: Progressing ?04/24/2021 0011 by Derrill Kay, RN ?Outcome: Progressing ?  ?Problem: Coping: ?Goal: Level of anxiety will decrease ?04/24/2021 0012 by Derrill Kay, RN ?Outcome: Progressing ?04/24/2021 0011 by Derrill Kay, RN ?Outcome: Progressing ?  ?Problem: Pain Managment: ?Goal: General experience of comfort will improve ?04/24/2021 0012 by Derrill Kay, RN ?Outcome: Progressing ?04/24/2021 0011 by Derrill Kay, RN ?Outcome: Progressing ?  ?

## 2021-04-24 NOTE — Progress Notes (Signed)
Pharmacy Antibiotic Note ? ?Tricia Mendoza is a 71 y.o. female with recent dental procedure 1 month ago admitted on 04/22/2021 with daily fevers for +30 days, night sweats and fatigue concerning for  possible endocarditis .  Pharmacy has been consulted for vancomycin dosing. Ceftriaxone has been ordered per MD. ? ?SCr is starting to trend up - 0.75>> 0.96. ?WBC remains within normal limits.  ?Tmax 101.5 ? ?Plan: ?Adjust vancomycin at 750 mg to every 36 hours for [facility] of 435 using Scr 0.96 ?Follow up clinical progress and cultures ?Deescalate as indicated  ?Levels as steady state ? ?Height: '5\' 1"'$  (154.9 cm) ?Weight: 44 kg (97 lb) ?IBW/kg (Calculated) : 47.8 ? ?Temp (24hrs), Avg:99.4 ?F (37.4 ?C), Min:97.9 ?F (36.6 ?C), Max:101.5 ?F (38.6 ?C) ? ?Recent Labs  ?Lab 04/22/21 ?2117 04/24/21 ?0103  ?WBC 7.9 7.0  ?CREATININE 0.75 0.96  ?LATICACIDVEN 0.7  --   ?  ?Estimated Creatinine Clearance: 38.4 mL/min (by C-G formula based on SCr of 0.96 mg/dL).   ? ?Allergies  ?Allergen Reactions  ? Sulfa Antibiotics Hives and Itching  ? ? ?Antimicrobials this admission: ?Ceftriaxone 3/28 >>  ?Vancomycin 3/28 >>  ? ?Dose adjustments this admission: ?none ? ?Microbiology results: ?3/27 UCx 2K Proteus mirabilis  ?3/27BCx: NG <12 h ?3/28 BCx: IP  ? ? ? ?Thank you for allowing pharmacy to participate in this patient's care. ? ?Sloan Leiter, PharmD ?Clinical Pharmacist ?Please refer to [facility] for [place] numbers ?04/24/2021 11:04 AM ? ? ?

## 2021-04-24 NOTE — Hospital Course (Addendum)
#  Fever, unknown origin ? Fever started 1 month ago after having a dental procedure initially prescribed amoxicillin she followed up with her primary care physician who prescribed a course of doxycycline however her fevers have continued. She has had some progressive fatigue, palpitations and tachycardia and drenching night sweats. She recently saw her PCP and she was found to have a new anemia, of which he urged her to come to the hospital for further evaluation. TTE showed no evidence of valvular vegetations, however, TEE is recommended to exclude infective endocarditis. No major Duke criteria are met and only 1 minor criteria is met- fevers, making IE not the most likely diagnosis. Of note, blood cultures are showing no growth at 4 days and no growth at 3 days (2 different sets). Patient was started on vanc and ceftriaxone, however, we discontinued these on 3/29 and repeated cultures on 3/31 after patient had been off antibiotics for 2 days. CT maxillofacial was ordered to rule out dental abscess that could be seeding infection and it ruled out any facial abscess or cellulitis. TEE performed on 3/31 and did not show any valvular vegetations and showed normal LV/RV function. Repeat blood cultures were drawn on the day of discharge and if they return positive, advised the patient that we will call her to return to the hospital, however, we have a low suspicion that this will be the case. Of note, according to UpToDate, renal cell carcinoma is a common cause of fever of unknown origin. Strongly suspect that her intermittent fevers are being caused by the new renal mass.  ?  ?#Right kidney mass, concerning for renal cell carcinoma ?#Atrophic left kidney ?Patient presented with daily fevers x 1 month, fatigue, and drenching night sweats. Patient was found to have a new anemia on labs by her PCP and she was advised to come to the hospital. CT abd/pelvis was obtained and showed a 6 cm right kidney mass concerning for  renal cell carcinoma. Urology was consulted and the patient will likely need a renal biopsy and possible a radical nephrectomy. She will also need further testing to determine how well her left kidney is functioning (noted to be atrophic on CT imaging). Will follow up with urology as an outpatient.  ?  ?#Normocytic anemia ?#Iron deficiency anemia ?Patient presented with a new normocytic anemia, Hb 7.6 on admission. Folate and B12 are within normal limits. Ferritin is elevated at 447, although iron is low at 14 and iron sat is low at 9%. LDH is reassuringly low. Will likely need a colonoscopy as an outpatient. Started on ferrous sulfate 325 mg daily and was discharged with this. ? ?#Thrombocytosis ?Patient presented with platelet count of 653, improved to 411. This could be reactive given the patient's normocytic anemia and her likely malignancy. Tech smear review showed normal platelet morphology. Suspect it is more likely secondary to the patient's iron deficiency anemia.  ?  ?#Hypertension ?SBP in the 120-140s. At home, she was previously on amlodipine 5 mg daily although this was switched to metoprolol 25 mg daily. Patient has been getting headaches from metoprolol and was restarted on her previous amlodipine dose. Discharged back with amlodipine 5 mg daily.  ?

## 2021-04-25 DIAGNOSIS — R509 Fever, unspecified: Secondary | ICD-10-CM | POA: Diagnosis not present

## 2021-04-25 DIAGNOSIS — N2889 Other specified disorders of kidney and ureter: Secondary | ICD-10-CM | POA: Diagnosis not present

## 2021-04-25 LAB — CBC
HCT: 22.4 % — ABNORMAL LOW (ref 36.0–46.0)
Hemoglobin: 7 g/dL — ABNORMAL LOW (ref 12.0–15.0)
MCH: 25.6 pg — ABNORMAL LOW (ref 26.0–34.0)
MCHC: 31.3 g/dL (ref 30.0–36.0)
MCV: 82.1 fL (ref 80.0–100.0)
Platelets: 497 10*3/uL — ABNORMAL HIGH (ref 150–400)
RBC: 2.73 MIL/uL — ABNORMAL LOW (ref 3.87–5.11)
RDW: 15.9 % — ABNORMAL HIGH (ref 11.5–15.5)
WBC: 6.5 10*3/uL (ref 4.0–10.5)
nRBC: 0 % (ref 0.0–0.2)

## 2021-04-25 LAB — URINE CULTURE: Culture: 2000 — AB

## 2021-04-25 LAB — PATHOLOGIST SMEAR REVIEW

## 2021-04-25 MED ORDER — POLYETHYLENE GLYCOL 3350 17 G PO PACK
17.0000 g | PACK | Freq: Two times a day (BID) | ORAL | Status: DC
  Administered 2021-04-25 – 2021-04-26 (×2): 17 g via ORAL
  Filled 2021-04-25 (×2): qty 1

## 2021-04-25 NOTE — TOC Progression Note (Signed)
Transition of Care (TOC) - Progression Note  ? ? ?Patient Details  ?Name: Tricia Mendoza ?MRN: [id number] ?Date of Birth: 17-Sep-1950 ? ?Transition of Care (TOC) CM/SW Contact  ?Marilu Favre, RN ?Phone Number: ?04/25/2021, 10:47 AM ? ?Clinical Narrative:    ? ?Plan TEE tomorrow.  ? ? ?Transition of Care (TOC) Screening Note ? ? ?Patient Details  ?Name: Tricia Mendoza ?Date of Birth: 02/28/1950 ? ? ? ? ?Transition of Care Department [facility]) has reviewed patient and no TOC needs have been identified at this time. We will continue to monitor patient advancement through interdisciplinary progression rounds. If new patient transition needs arise, please place a TOC consult. ?  ? ?  ?  ? ?Expected Discharge Plan and Services ?  ?  ?  ?  ?  ?                ?  ?  ?  ?  ?  ?  ?  ?  ?  ?  ? ? ?Social Determinants of Health (SDOH) Interventions ?  ? ?Readmission Risk Interventions ?   ? View : No data to display.  ?  ?  ?  ? ? ?

## 2021-04-25 NOTE — Plan of Care (Signed)

## 2021-04-25 NOTE — Progress Notes (Signed)
? ?HD#2 ?SUBJECTIVE:  ?Patient Summary: Tricia Mendoza is a 71 y.o. with a pertinent PMH of hypertension, who presented with daily fevers and fatigue and admitted for fever of unknown origin likely in the setting of a new large right renal mass.  ? ?Overnight Events: No acute events overnight ? ?Interim History: This is hospital day 2 for this patient. She is feeling kind of down today, the weight of the situation and wanting more answers is hitting her. She continues to soak the bed with her drenching night sweats. Otherwise she feels well and is eager to get home.  ? ?OBJECTIVE:  ?Vital Signs: ?Vitals:  ? 04/24/21 1615 04/24/21 1820 04/24/21 2053 04/25/21 0553  ?BP: 138/66  (!) 124/58 135/69  ?Pulse: (!) 102  92 92  ?Resp: '16  16 16  '$ ?Temp: 99.8 ?F (37.7 ?C) (!) 102.5 ?F (39.2 ?C) 98.5 ?F (36.9 ?C) 99.9 ?F (37.7 ?C)  ?TempSrc: Oral Oral Oral Oral  ?SpO2: 100%  98% 98%  ?Weight:      ?Height:      ? ?Supplemental O2: Room Air ?SpO2: 98 % ? ?Filed Weights  ? 04/22/21 2042  ?Weight: 44 kg  ? ? ?No intake or output data in the 24 hours ending 04/25/21 0717 ?Net IO Since Admission: 200 mL [04/25/21 0717] ? ?Physical Exam: ?General: Pleasant, well-appearing elderly female sitting in bedside chair. No acute distress. ?CV: RRR, mid systolic click at LLSB. No murmurs, rubs, or gallops. No LE edema ?Pulmonary: Lungs CTAB. Normal effort. No wheezing or rales. ?Abdominal: Soft, nontender, nondistended. Normal bowel sounds. ?Extremities: Palpable radial and DP pulses. Normal ROM. ?Skin: Warm and dry.  ?Neuro: A&Ox3. Moves all extremities. Normal sensation. No focal deficit. ?Psych: Normal mood and affect ? ? ? ?ASSESSMENT/PLAN:  ?Assessment: ?Principal Problem: ?  Fever in adult ?Active Problems: ?  Right kidney mass ?  Atrophy of left kidney ? ? ?Plan: ?#Fever, unknown origin ?Fevers started 1 month ago after having a dental procedure. She was initially prescribed amoxicillin and then doxycycline, however, fevers persisted.  TTE showed no evidence of valvular vegetations, however, will obtain a TEE to exclude infective endocarditis/vegetations as the etiology of her fevers. CT maxillofacial was ordered to rule out dental abscess that could be seeding infection and it ruled out any facial abscess or cellulitis. Vanc and ceftriaxone were discontinued yesterday as blood cultures are showing no growth at 2 days and 1 day. Patient had one fever overnight up to 102.5. Of note, patient had silicone breast implants ~12 years ago and is wondering if the silicone could be contributing to her fevers. She was recommended to get a breast MRI a few years after getting the implants, but did not have this completed. ?- Repeat blood cultures tomorrow ?- TEE tomorrow ?- Consider breast MRI as an outpatient ? ?#Right kidney mass, concerning for renal cell carcinoma ?#Atrophic left kidney ?CT abd/pelvis was obtained as part of the fever of unknown origin workup and showed a 6 cm right kidney mass concerning for renal cell carcinoma. Urology was consulted and the patient will likely need a renal biopsy and possible a radical nephrectomy as an outpatient. She will also need further testing to determine function of left kidney, as it was noted to be atrophic.  ?- Outpatient urology follow up ?- Daily BMP ? ?#Normocytic anemia ?#Iron deficiency anemia ?Hb 7.0 today and iron levels are low, as well. Patient feels well today and we will hold off on blood transfusions unless she becomes  symptomatic. She will need an outpatient colonoscopy to further work this up.  ?- Continue ferrous sulfate 325 mg daily  ? ?#Thrombocytosis ?Platelets remain elevated at 497 today. Tech review of blood smear showed normal morphology.  ?- Pathology smear review in process ?- Daily CBC ? ?#Hypertension ?Blood pressure has been well controlled during admission.  ?- Amlodipine 5 mg daily ? ? ?Best Practice: ?Diet: Regular diet ?IVF: Fluids: none ?VTE: enoxaparin (LOVENOX) injection 30  mg Start: 04/23/21 1100 ?Code: DNR ?AB: None ?Therapy Recs: None ?DISPO: Anticipated discharge in 1-2 days to Home pending Medical stability. ? ?Signature: ?Goldie Tregoning, D.O.  ?Internal Medicine Resident, PGY-1 ?[facility]  ?Pager: [phone] ?7:17 AM, 04/25/2021  ? ?Please contact the on call pager after 5 pm and on weekends at [phone]. ? ?

## 2021-04-26 ENCOUNTER — Telehealth: Payer: Self-pay | Admitting: Internal Medicine

## 2021-04-26 ENCOUNTER — Encounter (HOSPITAL_COMMUNITY)
Admission: EM | Disposition: A | Discharge status: Home / Self Care | Payer: Self-pay | Source: Ambulatory Visit | Attending: Internal Medicine

## 2021-04-26 ENCOUNTER — Inpatient Hospital Stay (HOSPITAL_COMMUNITY): Payer: Medicare Other | Admitting: Anesthesiology

## 2021-04-26 ENCOUNTER — Encounter (HOSPITAL_COMMUNITY): Payer: Self-pay | Admitting: Internal Medicine

## 2021-04-26 ENCOUNTER — Inpatient Hospital Stay (HOSPITAL_COMMUNITY): Payer: Medicare Other

## 2021-04-26 DIAGNOSIS — I341 Nonrheumatic mitral (valve) prolapse: Secondary | ICD-10-CM

## 2021-04-26 DIAGNOSIS — I340 Nonrheumatic mitral (valve) insufficiency: Secondary | ICD-10-CM | POA: Diagnosis not present

## 2021-04-26 DIAGNOSIS — I3139 Other pericardial effusion (noninflammatory): Secondary | ICD-10-CM

## 2021-04-26 DIAGNOSIS — I10 Essential (primary) hypertension: Secondary | ICD-10-CM

## 2021-04-26 DIAGNOSIS — D509 Iron deficiency anemia, unspecified: Secondary | ICD-10-CM

## 2021-04-26 HISTORY — PX: TEE WITHOUT CARDIOVERSION: SHX5443

## 2021-04-26 LAB — CBC
HCT: 22.9 % — ABNORMAL LOW (ref 36.0–46.0)
Hemoglobin: 7.2 g/dL — ABNORMAL LOW (ref 12.0–15.0)
MCH: 25.9 pg — ABNORMAL LOW (ref 26.0–34.0)
MCHC: 31.4 g/dL (ref 30.0–36.0)
MCV: 82.4 fL (ref 80.0–100.0)
Platelets: 515 10*3/uL — ABNORMAL HIGH (ref 150–400)
RBC: 2.78 MIL/uL — ABNORMAL LOW (ref 3.87–5.11)
RDW: 15.9 % — ABNORMAL HIGH (ref 11.5–15.5)
WBC: 5.9 10*3/uL (ref 4.0–10.5)
nRBC: 0 % (ref 0.0–0.2)

## 2021-04-26 SURGERY — ECHOCARDIOGRAM, TRANSESOPHAGEAL
Anesthesia: General

## 2021-04-26 MED ORDER — AMLODIPINE BESYLATE 5 MG PO TABS: 5.0000 mg | ORAL_TABLET | Freq: Every day | ORAL | 0 refills | Status: AC

## 2021-04-26 MED ORDER — PROPOFOL 10 MG/ML IV BOLUS
INTRAVENOUS | Status: DC | PRN
  Administered 2021-04-26 (×5): 10 mg via INTRAVENOUS

## 2021-04-26 MED ORDER — PROPOFOL 500 MG/50ML IV EMUL
INTRAVENOUS | Status: DC | PRN
  Administered 2021-04-26: 125 ug/kg/min via INTRAVENOUS

## 2021-04-26 MED ORDER — FERROUS SULFATE 325 (65 FE) MG PO TABS: 325.0000 mg | ORAL_TABLET | Freq: Every day | ORAL | 0 refills | Status: AC

## 2021-04-26 MED ORDER — SODIUM CHLORIDE 0.9 % IV SOLN: INTRAVENOUS | Status: DC

## 2021-04-26 NOTE — Telephone Encounter (Signed)
Contacted nurse over at the hospital- patient family and patient were waiting to see if Dr.Branch was going to speak with patient before she went home- nurse then advised she received pager number- she will try to contact her this way.  ?Thanks! ?

## 2021-04-26 NOTE — Progress Notes (Signed)
?  Echocardiogram ?Echocardiogram Transesophageal has been performed. ? ?Tricia Mendoza ?04/26/2021, 12:26 PM ?

## 2021-04-26 NOTE — Transfer of Care (Signed)
Immediate Anesthesia Transfer of Care Note ? ?Patient: Tricia Mendoza ? ?Procedure(s) Performed: TRANSESOPHAGEAL ECHOCARDIOGRAM (TEE) ? ?Patient Location: PACU ? ?Anesthesia Type:MAC ? ?Level of Consciousness: drowsy and patient cooperative ? ?Airway & Oxygen Therapy: Patient Spontanous Breathing ? ?Post-op Assessment: Report given to RN, Post -op Vital signs reviewed and stable and Patient moving all extremities X 4 ? ?Post vital signs: Reviewed and stable ? ?Last Vitals:  ?Vitals Value Taken Time  ?BP 112/50 04/26/21 1221  ?Temp    ?Pulse 84 04/26/21 1221  ?Resp 18 04/26/21 1221  ?SpO2 98 % 04/26/21 1221  ?Vitals shown include unvalidated device data. ? ?Last Pain:  ?Vitals:  ? 04/26/21 1050  ?TempSrc: Temporal  ?PainSc: 0-No pain  ?   ? ?  ? ?Complications: No notable events documented. ?

## 2021-04-26 NOTE — Care Management Important Message (Signed)
Important Message ? ?Patient Details  ?Name: Tricia Mendoza ?MRN: [id number] ?Date of Birth: 09-Jan-1951 ? ? ?Medicare Important Message Given:  Yes ? ? ? ? ?Levada Dy  Teiara Baria-Martin ?04/26/2021, 1:38 PM ?

## 2021-04-26 NOTE — Discharge Summary (Signed)
? ?Name: Tricia Mendoza ?MRN: [id number] ?DOB: 12/25/1950 71 y.o. ?PCP: Neale Burly, MD ? ?Date of Admission: 04/22/2021  8:36 PM ?Date of Discharge:  04/26/2021 ?Attending Physician: Dr. Angelia Mould ? ?DISCHARGE DIAGNOSIS:  ?Primary Problem: Fever in adult  ? ?Hospital Problems: ?Principal Problem: ?  Fever in adult ?Active Problems: ?  Right kidney mass ?  Atrophy of left kidney ?  Iron deficiency anemia ?  Essential hypertension ?  ? ?DISCHARGE MEDICATIONS:  ? ?Allergies as of 04/26/2021   ? ?   Reactions  ? Sulfa Antibiotics Hives, Itching  ? ?  ? ?  ?Medication List  ?  ? ?STOP taking these medications   ? ?metoprolol succinate 25 MG 24 hr tablet ?Commonly known as: TOPROL-XL ?  ? ?  ? ?TAKE these medications   ? ?acetaminophen 500 MG tablet ?Commonly known as: TYLENOL ?Take 1,000 mg by mouth every 6 (six) hours as needed for mild pain or fever. ?  ?amLODipine 5 MG tablet ?Commonly known as: NORVASC ?Take 1 tablet (5 mg total) by mouth daily. ?  ?b complex vitamins capsule ?Take 1 capsule by mouth daily. ?  ?CoQ10 100 MG Caps ?Take 100 mg by mouth daily. ?  ?ferrous sulfate 325 (65 FE) MG tablet ?Take 1 tablet (325 mg total) by mouth daily with breakfast. ?  ?MAGNESIUM CITRATE PO ?Take 750 mg by mouth daily. ?  ?multivitamin tablet ?Take 1 tablet by mouth daily. ?  ? ?  ? ? ?DISPOSITION AND FOLLOW-UP:  ?TriciaTricia Mendoza was discharged from [facility] in Good condition. At the hospital follow up visit please address: ? ?Right renal mass: Presented with fever and throughout fever of unknown origin workup, patient was found to have renal mass. Will need urology follow up for biopsy and possible radical nephrectomy. Fevers are most likely caused by the renal mass, if RCC diagnosis is confirmed. ? ?Iron deficiency anemia: Hb in the 7 range throughout admission. Started on ferrous sulfate 325 mg daily. Will need CBC recheck in 6-8 weeks ? ?Follow-up Recommendations: ?Consults: Urology ?Labs:  CBC ?Studies: Renal biopsy ?Medications: Ferrous sulfate 325 mg daily, amlodipine 5 mg daily  ? ?Follow-up Appointments: ? Follow-up Information   ? ? Ardis Hughs, MD. Schedule an appointment as soon as possible for a visit in 2 week(s).   ?Specialty: Urology ?Contact information: ?[address] ?[place] [address] ?[phone] ? ? ?  ?  ? ? McKenzie, Candee Furbish, MD. Call.   ?Specialty: Urology ?Contact information: ?[address] ?[place] [address] ?[phone] ? ? ?  ?  ? ? [facility] up.   ?Why: This is the information for the Resident Clinic at [facility]. ?Contact information: ?[address] ?[place] ?[phone] ? ?  ?  ? ?  ?  ? ?  ? ? ?HOSPITAL COURSE:  ?Patient Summary: ?#Fever, unknown origin ? Fever started 1 month ago after having a dental procedure initially prescribed amoxicillin she followed up with her primary care physician who prescribed a course of doxycycline however her fevers have continued. She has had some progressive fatigue, palpitations and tachycardia and drenching night sweats. She recently saw her PCP and she was found to have a new anemia, of which he urged her to come to the hospital for further evaluation. TTE showed no evidence of valvular vegetations, however, TEE is recommended to exclude infective endocarditis. No major [facility] criteria are met  and only 1 minor criteria is met- fevers, making IE not the most likely diagnosis. Of note, blood cultures are showing no growth at 4 days and no growth at 3 days (2 different sets). Patient was started on vanc and ceftriaxone, however, we discontinued these on 3/29 and repeated cultures on 3/31 after patient had been off antibiotics for 2 days. CT maxillofacial was ordered to rule out dental abscess that could be seeding infection and it ruled out any facial abscess or cellulitis. TEE performed on 3/31 and did not show any valvular vegetations and showed  normal LV/RV function. Repeat blood cultures were drawn on the day of discharge and if they return positive, advised the patient that we will call her to return to the hospital, however, we have a low suspicion that this will be the case. Of note, according to UpToDate, renal cell carcinoma is a common cause of fever of unknown origin. Strongly suspect that her intermittent fevers are being caused by the new renal mass.  ?  ?#Right kidney mass, concerning for renal cell carcinoma ?#Atrophic left kidney ?Patient presented with daily fevers x 1 month, fatigue, and drenching night sweats. Patient was found to have a new anemia on labs by her PCP and she was advised to come to the hospital. CT abd/pelvis was obtained and showed a 6 cm right kidney mass concerning for renal cell carcinoma. Urology was consulted and the patient will likely need a renal biopsy and possible a radical nephrectomy. She will also need further testing to determine how well her left kidney is functioning (noted to be atrophic on CT imaging). Will follow up with urology as an outpatient.  ?  ?#Normocytic anemia ?#Iron deficiency anemia ?Patient presented with a new normocytic anemia, Hb 7.6 on admission. Folate and B12 are within normal limits. Ferritin is elevated at 447, although iron is low at 14 and iron sat is low at 9%. LDH is reassuringly low. Will likely need a colonoscopy as an outpatient. Started on ferrous sulfate 325 mg daily and was discharged with this. ? ?#Thrombocytosis ?Patient presented with platelet count of 653, improved to 411. This could be reactive given the patient's normocytic anemia and her likely malignancy. Tech smear review showed normal platelet morphology. Suspect it is more likely secondary to the patient's iron deficiency anemia.  ?  ?#Hypertension ?SBP in the 120-140s. At home, she was previously on amlodipine 5 mg daily although this was switched to metoprolol 25 mg daily. Patient has been getting headaches from  metoprolol and was restarted on her previous amlodipine dose. Discharged back with amlodipine 5 mg daily.   ? ?DISCHARGE INSTRUCTIONS:  ? ?Discharge Instructions   ? ? Ambulatory referral to Urology   Complete by: As directed ?  ? Possible renal cell carcinoma  ? Call MD for:  difficulty breathing, headache or visual disturbances   Complete by: As directed ?  ? Call MD for:  persistant dizziness or light-headedness   Complete by: As directed ?  ? Call MD for:  persistant nausea and vomiting   Complete by: As directed ?  ? Call MD for:  severe uncontrolled pain   Complete by: As directed ?  ? Diet general   Complete by: As directed ?  ? Discharge instructions   Complete by: As directed ?  ? Dear Tricia Mendoza, ? ?You were hospitalized for your fevers and drenching night sweats, which was most likely caused by the new renal mass we found on your CT  scan. We suspect that this will turn out to be renal cell carcinoma, but you will need to follow up with the Urologist for a biopsy for the official diagnosis and for further care. The number for [facility] is [phone] - or you can follow up with the Urologist closer to your home in [place]. ? ?We also found that you have iron deficiency anemia. Please take the daily iron supplement I sent to your pharmacy ([facility] in [place]) everyday. You will likely need to take this for at least 6-8 weeks and hopefully with that, your hemoglobin levels will improve.  ? ?For your blood pressure, stop taking metoprolol and you can start taking your amlodipine 5 mg daily again. Please follow up with your primary care physician in 1-2 weeks to check your blood counts. If you have any questions or concerns, call our residency clinic at [phone] or after hours call [phone] and ask for the internal medicine resident on call. ? ?It was a pleasure to take care of you Tricia Mendoza, but we are happy you are able to go home now! ? ?All the best, ?Dr. Buddy Duty  ? Increase activity  slowly   Complete by: As directed ?  ? ?  ? ? ?SUBJECTIVE:  ?Tricia Mendoza was seen and evaluated on the day of discharge. She feels very well and is eager to get home. She denies any dizziness/lightheadedness.  ?

## 2021-04-26 NOTE — Progress Notes (Signed)
? ? ?  [facility] has been requested to perform a transesophageal echocardiogram on Tricia Mendoza for fever of unknown origin.  After careful review of history and examination, the risks and benefits of transesophageal echocardiogram have been explained including risks of esophageal damage, perforation (1:10,000 risk), bleeding, pharyngeal hematoma as well as other potential complications associated with conscious sedation including aspiration, arrhythmia, respiratory failure and death. Alternatives to treatment were discussed, questions were answered. Patient is willing to proceed.  ? ?Pt scheduled for TEE today. Hb 7.2, this has been stable, no active bleeding. ? ?[name], PA  ?04/26/2021 10:22 AM  ? ?

## 2021-04-26 NOTE — CV Procedure (Signed)
INDICATIONS: ?infective endocarditis ? ?PROCEDURE:  ? ?Informed consent was obtained prior to the procedure. The risks, benefits and alternatives for the procedure were discussed and the patient comprehended these risks.  Risks include, but are not limited to, cough, sore throat, vomiting, nausea, somnolence, esophageal and stomach trauma or perforation, bleeding, low blood pressure, aspiration, pneumonia, infection, trauma to the teeth and death.   ? ?After a procedural time-out, the oropharynx was anesthetized with 20% benzocaine spray.  ? ?During this procedure the patient was administered propofol achieve and maintain moderate conscious sedation.  The patient's heart rate, blood pressure, and oxygen saturationweare monitored continuously during the procedure. The period of conscious sedation was 20 minutes, of which I was present face-to-face 100% of this time. ? ?The transesophageal probe was inserted in the esophagus and stomach without difficulty and multiple views were obtained.  The patient was kept under observation until the patient left the procedure room.  The patient left the procedure room in stable condition.  ? ?Agitated microbubble saline contrast was not administered. ? ?COMPLICATIONS:   ? ?There were no immediate complications. ? ?FINDINGS:  ?Normal LV/RV function ?Mild mitral valve prolapse P2 ?Mild mitral regurgitation ?No valvular vegetations ? ?RECOMMENDATIONS:   ? ? None ? ?Time Spent Directly with the Patient: ? ?20 minutes  ? ?Phineas Inches E ?04/26/2021, 12:20 PM ? ?

## 2021-04-26 NOTE — Anesthesia Postprocedure Evaluation (Signed)
Anesthesia Post Note ? ?Patient: Shamiah Kahler ? ?Procedure(s) Performed: TRANSESOPHAGEAL ECHOCARDIOGRAM (TEE) ? ?  ? ?Patient location during evaluation: PACU ?Anesthesia Type: General ?Level of consciousness: awake and alert and oriented ?Pain management: pain level controlled ?Vital Signs Assessment: post-procedure vital signs reviewed and stable ?Respiratory status: spontaneous breathing, nonlabored ventilation and respiratory function stable ?Cardiovascular status: blood pressure returned to baseline and stable ?Postop Assessment: no apparent nausea or vomiting ?Anesthetic complications: no ? ? ?No notable events documented. ? ?Last Vitals:  ?Vitals:  ? 04/26/21 1235 04/26/21 1250  ?BP: 105/81 (!) 114/59  ?Pulse: 82 91  ?Resp: 17 (!) 21  ?Temp:  37.2 ?C  ?SpO2: 100% 99%  ?  ?Last Pain:  ?Vitals:  ? 04/26/21 1250  ?TempSrc:   ?PainSc: 0-No pain  ? ? ?  ?  ?  ?  ?  ?  ? ?Tion Tse A. ? ? ? ? ?

## 2021-04-26 NOTE — Progress Notes (Signed)
Mobility Specialist Progress Note: ? ? 04/26/21 1122  ?Mobility  ?Activity Off unit  ? ?Will follow-up as time allows.  ? ?Hansini Clodfelter ?Mobility Specialist ?Primary Phone [phone] ? ?

## 2021-04-26 NOTE — Telephone Encounter (Signed)
Nurse is requesting a call back from Dr. Harl Bowie in regard to this pt... please advise ?

## 2021-04-26 NOTE — H&P (View-Only) (Signed)
? ? ?  [facility] has been requested to perform a transesophageal echocardiogram on Tricia Mendoza for fever of unknown origin.  After careful review of history and examination, the risks and benefits of transesophageal echocardiogram have been explained including risks of esophageal damage, perforation (1:10,000 risk), bleeding, pharyngeal hematoma as well as other potential complications associated with conscious sedation including aspiration, arrhythmia, respiratory failure and death. Alternatives to treatment were discussed, questions were answered. Patient is willing to proceed.  ? ?Pt scheduled for TEE today. Hb 7.2, this has been stable, no active bleeding. ? ?[name], PA  ?04/26/2021 10:22 AM  ? ?

## 2021-04-26 NOTE — Interval H&P Note (Signed)
History and Physical Interval Note: ? ?04/26/2021 ?10:50 AM ? ?Tricia Mendoza  has presented today for surgery, with the diagnosis of BACTEREMIA.  The various methods of treatment have been discussed with the patient and family. After consideration of risks, benefits and other options for treatment, the patient has consented to  Procedure(s): ?TRANSESOPHAGEAL ECHOCARDIOGRAM (TEE) (N/A) as a surgical intervention.  The patient's history has been reviewed, patient examined, no change in status, stable for surgery.  I have reviewed the patient's chart and labs.  Questions were answered to the patient's satisfaction.   ? ? ?Tricia Mendoza E ? ? ?

## 2021-04-26 NOTE — Anesthesia Preprocedure Evaluation (Signed)
Anesthesia Evaluation  ?Patient identified by MRN, date of birth, ID band ?Patient awake ? ? ? ?Reviewed: ?Allergy & Precautions, NPO status , Patient's Chart, lab work & pertinent test results, reviewed documented beta blocker date and time  ? ?History of Anesthesia Complications ?(+) PONV and history of anesthetic complications ? ?Airway ?Mallampati: II ? ?TM Distance: >3 FB ?Neck ROM: Full ? ? ? Dental ? ?(+) Partial Lower, Partial Upper ?  ?Pulmonary ?neg pulmonary ROS,  ?  ?Pulmonary exam normal ?breath sounds clear to auscultation ? ? ? ? ? ? Cardiovascular ?negative cardio ROS ?Normal cardiovascular exam ?Rhythm:Regular Rate:Normal ? ? ?  ?Neuro/Psych ? Headaches, negative psych ROS  ? GI/Hepatic ?negative GI ROS, Neg liver ROS,   ?Endo/Other  ?negative endocrine ROS ? Renal/GU ?Renal diseaseRight renal mass ?Small left kidney  ?negative genitourinary ?  ?Musculoskeletal ?negative musculoskeletal ROS ?(+)  ? Abdominal ?  ?Peds ? Hematology ? ?(+) Blood dyscrasia, anemia ,   ?Anesthesia Other Findings ? ? Reproductive/Obstetrics ? ?  ? ? ? ? ? ? ? ? ? ? ? ? ? ?  ?  ? ? ? ? ? ? ? ? ?Anesthesia Physical ?Anesthesia Plan ? ?ASA: 2 ? ?Anesthesia Plan: General  ? ?Post-op Pain Management:   ? ?Induction: Intravenous ? ?PONV Risk Score and Plan: 3 and Treatment may vary due to age or medical condition ? ?Airway Management Planned: Natural Airway and Simple Face Mask ? ?Additional Equipment:  ? ?Intra-op Plan:  ? ?Post-operative Plan: Extubation in OR ? ?Informed Consent: I have reviewed the patients History and Physical, chart, labs and discussed the procedure including the risks, benefits and alternatives for the proposed anesthesia with the patient or authorized representative who has indicated his/her understanding and acceptance.  ? ?Patient has DNR.  ?Suspend DNR and Discussed DNR with patient. ?  ?Dental advisory given ? ?Plan Discussed with: CRNA and  Anesthesiologist ? ?Anesthesia Plan Comments:   ? ? ? ? ? ? ?Anesthesia Quick Evaluation ? ?

## 2021-04-27 LAB — CULTURE, BLOOD (ROUTINE X 2)
Culture: NO GROWTH
Culture: NO GROWTH

## 2021-04-28 ENCOUNTER — Encounter (HOSPITAL_COMMUNITY): Payer: Self-pay | Admitting: Internal Medicine

## 2021-04-28 LAB — CULTURE, BLOOD (ROUTINE X 2)
Culture: NO GROWTH
Culture: NO GROWTH
Special Requests: ADEQUATE
Special Requests: ADEQUATE

## 2021-04-30 ENCOUNTER — Other Ambulatory Visit: Payer: Self-pay | Admitting: Urology

## 2021-04-30 ENCOUNTER — Other Ambulatory Visit (HOSPITAL_COMMUNITY): Payer: Self-pay | Admitting: Urology

## 2021-04-30 DIAGNOSIS — Q614 Renal dysplasia: Secondary | ICD-10-CM

## 2021-04-30 DIAGNOSIS — D4101 Neoplasm of uncertain behavior of right kidney: Secondary | ICD-10-CM | POA: Diagnosis not present

## 2021-04-30 DIAGNOSIS — R5081 Fever presenting with conditions classified elsewhere: Secondary | ICD-10-CM | POA: Diagnosis not present

## 2021-04-30 NOTE — Progress Notes (Signed)
Patient has a BMI of 18.3, thus she is underweight, there were no signs of malnutrition. ?

## 2021-05-01 ENCOUNTER — Encounter: Payer: Self-pay | Admitting: *Deleted

## 2021-05-01 ENCOUNTER — Other Ambulatory Visit (HOSPITAL_COMMUNITY): Payer: Self-pay | Admitting: Urology

## 2021-05-01 ENCOUNTER — Other Ambulatory Visit: Payer: Self-pay | Admitting: Urology

## 2021-05-01 DIAGNOSIS — D49511 Neoplasm of unspecified behavior of right kidney: Secondary | ICD-10-CM

## 2021-05-01 DIAGNOSIS — N2889 Other specified disorders of kidney and ureter: Secondary | ICD-10-CM

## 2021-05-01 LAB — CULTURE, BLOOD (ROUTINE X 2)
Culture: NO GROWTH
Culture: NO GROWTH
Special Requests: ADEQUATE

## 2021-05-01 NOTE — Progress Notes (Unsigned)
Tricia Cleveland, MD  Tricia Mendoza ?Ok  ? ?CT or [place] core biopsy R renal mass  ?Urologist request: solitary functional kidney, fever  ? ?[facility]   ?  ?   ?Previous Messages ?  ?----- Message -----  ?From: Tricia Mendoza  ?Sent: 05/01/2021   8:57 AM EDT  ?To: [facility] Procedure Requests  ?Subject: US Biopsy                                      ? ? ? ?US Biopsy  ? ?Renal mass  ? ?Ct done 04/23/21  ? ?Dr. Louis Meckel  ?[phone] ?

## 2021-05-03 DIAGNOSIS — D649 Anemia, unspecified: Secondary | ICD-10-CM | POA: Diagnosis not present

## 2021-05-07 ENCOUNTER — Encounter (HOSPITAL_COMMUNITY): Payer: Self-pay

## 2021-05-07 ENCOUNTER — Encounter (HOSPITAL_COMMUNITY): Payer: Medicare Other

## 2021-05-14 ENCOUNTER — Encounter (HOSPITAL_COMMUNITY): Payer: Self-pay

## 2021-05-14 ENCOUNTER — Ambulatory Visit (HOSPITAL_COMMUNITY): Payer: Medicare Other

## 2021-05-17 DIAGNOSIS — D4101 Neoplasm of uncertain behavior of right kidney: Secondary | ICD-10-CM | POA: Diagnosis not present

## 2021-05-21 DIAGNOSIS — D49519 Neoplasm of unspecified behavior of unspecified kidney: Secondary | ICD-10-CM | POA: Diagnosis not present

## 2021-05-22 DIAGNOSIS — N2889 Other specified disorders of kidney and ureter: Secondary | ICD-10-CM | POA: Diagnosis not present

## 2021-05-23 DIAGNOSIS — R911 Solitary pulmonary nodule: Secondary | ICD-10-CM | POA: Diagnosis not present

## 2021-05-23 DIAGNOSIS — N2889 Other specified disorders of kidney and ureter: Secondary | ICD-10-CM | POA: Diagnosis not present

## 2021-05-25 ENCOUNTER — Other Ambulatory Visit: Payer: Self-pay | Admitting: Internal Medicine

## 2021-05-28 ENCOUNTER — Other Ambulatory Visit: Payer: Self-pay | Admitting: Internal Medicine

## 2021-05-30 DIAGNOSIS — M25551 Pain in right hip: Secondary | ICD-10-CM | POA: Diagnosis not present

## 2021-05-30 DIAGNOSIS — M542 Cervicalgia: Secondary | ICD-10-CM | POA: Diagnosis not present

## 2021-05-30 DIAGNOSIS — M9902 Segmental and somatic dysfunction of thoracic region: Secondary | ICD-10-CM | POA: Diagnosis not present

## 2021-05-30 DIAGNOSIS — M9903 Segmental and somatic dysfunction of lumbar region: Secondary | ICD-10-CM | POA: Diagnosis not present

## 2021-05-30 DIAGNOSIS — M9901 Segmental and somatic dysfunction of cervical region: Secondary | ICD-10-CM | POA: Diagnosis not present

## 2021-05-30 DIAGNOSIS — M5431 Sciatica, right side: Secondary | ICD-10-CM | POA: Diagnosis not present

## 2021-05-31 DIAGNOSIS — D4101 Neoplasm of uncertain behavior of right kidney: Secondary | ICD-10-CM | POA: Diagnosis not present

## 2021-06-06 DIAGNOSIS — I493 Ventricular premature depolarization: Secondary | ICD-10-CM | POA: Diagnosis not present

## 2021-06-06 DIAGNOSIS — R000 Tachycardia, unspecified: Secondary | ICD-10-CM | POA: Diagnosis not present

## 2021-06-06 DIAGNOSIS — I517 Cardiomegaly: Secondary | ICD-10-CM | POA: Diagnosis not present

## 2021-06-06 DIAGNOSIS — R9431 Abnormal electrocardiogram [ECG] [EKG]: Secondary | ICD-10-CM | POA: Diagnosis not present

## 2021-06-06 DIAGNOSIS — Z01818 Encounter for other preprocedural examination: Secondary | ICD-10-CM | POA: Diagnosis not present

## 2021-06-06 DIAGNOSIS — N2889 Other specified disorders of kidney and ureter: Secondary | ICD-10-CM | POA: Diagnosis not present

## 2021-06-06 DIAGNOSIS — D500 Iron deficiency anemia secondary to blood loss (chronic): Secondary | ICD-10-CM | POA: Diagnosis not present

## 2021-06-13 DIAGNOSIS — N179 Acute kidney failure, unspecified: Secondary | ICD-10-CM | POA: Diagnosis not present

## 2021-06-13 DIAGNOSIS — R140 Abdominal distension (gaseous): Secondary | ICD-10-CM | POA: Diagnosis not present

## 2021-06-13 DIAGNOSIS — N189 Chronic kidney disease, unspecified: Secondary | ICD-10-CM | POA: Diagnosis not present

## 2021-06-13 DIAGNOSIS — Z905 Acquired absence of kidney: Secondary | ICD-10-CM | POA: Diagnosis not present

## 2021-06-13 DIAGNOSIS — K6389 Other specified diseases of intestine: Secondary | ICD-10-CM | POA: Diagnosis not present

## 2021-06-13 DIAGNOSIS — K567 Ileus, unspecified: Secondary | ICD-10-CM | POA: Diagnosis not present

## 2021-06-13 DIAGNOSIS — R768 Other specified abnormal immunological findings in serum: Secondary | ICD-10-CM | POA: Diagnosis not present

## 2021-06-13 DIAGNOSIS — R110 Nausea: Secondary | ICD-10-CM | POA: Diagnosis not present

## 2021-06-13 DIAGNOSIS — G8918 Other acute postprocedural pain: Secondary | ICD-10-CM | POA: Diagnosis not present

## 2021-06-13 DIAGNOSIS — J811 Chronic pulmonary edema: Secondary | ICD-10-CM | POA: Diagnosis not present

## 2021-06-13 DIAGNOSIS — N184 Chronic kidney disease, stage 4 (severe): Secondary | ICD-10-CM | POA: Diagnosis not present

## 2021-06-13 DIAGNOSIS — I129 Hypertensive chronic kidney disease with stage 1 through stage 4 chronic kidney disease, or unspecified chronic kidney disease: Secondary | ICD-10-CM | POA: Diagnosis not present

## 2021-06-13 DIAGNOSIS — D509 Iron deficiency anemia, unspecified: Secondary | ICD-10-CM | POA: Diagnosis not present

## 2021-06-13 DIAGNOSIS — N2889 Other specified disorders of kidney and ureter: Secondary | ICD-10-CM | POA: Diagnosis not present

## 2021-06-13 DIAGNOSIS — C641 Malignant neoplasm of right kidney, except renal pelvis: Secondary | ICD-10-CM | POA: Diagnosis not present

## 2021-06-13 DIAGNOSIS — B370 Candidal stomatitis: Secondary | ICD-10-CM | POA: Diagnosis not present

## 2021-06-13 DIAGNOSIS — N170 Acute kidney failure with tubular necrosis: Secondary | ICD-10-CM | POA: Diagnosis not present

## 2021-06-13 DIAGNOSIS — Z882 Allergy status to sulfonamides status: Secondary | ICD-10-CM | POA: Diagnosis not present

## 2021-06-13 DIAGNOSIS — I341 Nonrheumatic mitral (valve) prolapse: Secondary | ICD-10-CM | POA: Diagnosis not present

## 2021-06-13 DIAGNOSIS — J90 Pleural effusion, not elsewhere classified: Secondary | ICD-10-CM | POA: Diagnosis not present

## 2021-06-13 DIAGNOSIS — Z90710 Acquired absence of both cervix and uterus: Secondary | ICD-10-CM | POA: Diagnosis not present

## 2021-06-26 DIAGNOSIS — K567 Ileus, unspecified: Secondary | ICD-10-CM | POA: Diagnosis not present

## 2021-06-26 DIAGNOSIS — E860 Dehydration: Secondary | ICD-10-CM | POA: Diagnosis not present

## 2021-06-27 DIAGNOSIS — K567 Ileus, unspecified: Secondary | ICD-10-CM | POA: Diagnosis not present

## 2021-07-05 DIAGNOSIS — K567 Ileus, unspecified: Secondary | ICD-10-CM | POA: Diagnosis not present

## 2021-07-05 DIAGNOSIS — E860 Dehydration: Secondary | ICD-10-CM | POA: Diagnosis not present

## 2021-07-10 DIAGNOSIS — C641 Malignant neoplasm of right kidney, except renal pelvis: Secondary | ICD-10-CM | POA: Diagnosis not present

## 2021-07-25 DIAGNOSIS — M9903 Segmental and somatic dysfunction of lumbar region: Secondary | ICD-10-CM | POA: Diagnosis not present

## 2021-07-25 DIAGNOSIS — M542 Cervicalgia: Secondary | ICD-10-CM | POA: Diagnosis not present

## 2021-07-25 DIAGNOSIS — M5431 Sciatica, right side: Secondary | ICD-10-CM | POA: Diagnosis not present

## 2021-07-25 DIAGNOSIS — M25551 Pain in right hip: Secondary | ICD-10-CM | POA: Diagnosis not present

## 2021-07-25 DIAGNOSIS — M9902 Segmental and somatic dysfunction of thoracic region: Secondary | ICD-10-CM | POA: Diagnosis not present

## 2021-07-25 DIAGNOSIS — M9901 Segmental and somatic dysfunction of cervical region: Secondary | ICD-10-CM | POA: Diagnosis not present

## 2021-08-01 DIAGNOSIS — M25551 Pain in right hip: Secondary | ICD-10-CM | POA: Diagnosis not present

## 2021-08-01 DIAGNOSIS — M9902 Segmental and somatic dysfunction of thoracic region: Secondary | ICD-10-CM | POA: Diagnosis not present

## 2021-08-01 DIAGNOSIS — M5431 Sciatica, right side: Secondary | ICD-10-CM | POA: Diagnosis not present

## 2021-08-01 DIAGNOSIS — M9901 Segmental and somatic dysfunction of cervical region: Secondary | ICD-10-CM | POA: Diagnosis not present

## 2021-08-01 DIAGNOSIS — M9903 Segmental and somatic dysfunction of lumbar region: Secondary | ICD-10-CM | POA: Diagnosis not present

## 2021-08-01 DIAGNOSIS — M542 Cervicalgia: Secondary | ICD-10-CM | POA: Diagnosis not present

## 2021-08-08 DIAGNOSIS — I10 Essential (primary) hypertension: Secondary | ICD-10-CM | POA: Diagnosis not present

## 2021-08-08 DIAGNOSIS — L03311 Cellulitis of abdominal wall: Secondary | ICD-10-CM | POA: Diagnosis not present

## 2021-08-13 DIAGNOSIS — H524 Presbyopia: Secondary | ICD-10-CM | POA: Diagnosis not present

## 2021-08-13 DIAGNOSIS — H43813 Vitreous degeneration, bilateral: Secondary | ICD-10-CM | POA: Diagnosis not present

## 2021-08-13 DIAGNOSIS — H25813 Combined forms of age-related cataract, bilateral: Secondary | ICD-10-CM | POA: Diagnosis not present

## 2021-10-04 DIAGNOSIS — M9901 Segmental and somatic dysfunction of cervical region: Secondary | ICD-10-CM | POA: Diagnosis not present

## 2021-10-04 DIAGNOSIS — M5431 Sciatica, right side: Secondary | ICD-10-CM | POA: Diagnosis not present

## 2021-10-04 DIAGNOSIS — M9902 Segmental and somatic dysfunction of thoracic region: Secondary | ICD-10-CM | POA: Diagnosis not present

## 2021-10-04 DIAGNOSIS — M9903 Segmental and somatic dysfunction of lumbar region: Secondary | ICD-10-CM | POA: Diagnosis not present

## 2021-10-04 DIAGNOSIS — M542 Cervicalgia: Secondary | ICD-10-CM | POA: Diagnosis not present

## 2021-10-04 DIAGNOSIS — M25551 Pain in right hip: Secondary | ICD-10-CM | POA: Diagnosis not present

## 2021-10-11 DIAGNOSIS — M9903 Segmental and somatic dysfunction of lumbar region: Secondary | ICD-10-CM | POA: Diagnosis not present

## 2021-10-11 DIAGNOSIS — M542 Cervicalgia: Secondary | ICD-10-CM | POA: Diagnosis not present

## 2021-10-11 DIAGNOSIS — M25551 Pain in right hip: Secondary | ICD-10-CM | POA: Diagnosis not present

## 2021-10-11 DIAGNOSIS — M5431 Sciatica, right side: Secondary | ICD-10-CM | POA: Diagnosis not present

## 2021-10-11 DIAGNOSIS — M9902 Segmental and somatic dysfunction of thoracic region: Secondary | ICD-10-CM | POA: Diagnosis not present

## 2021-10-11 DIAGNOSIS — M9901 Segmental and somatic dysfunction of cervical region: Secondary | ICD-10-CM | POA: Diagnosis not present

## 2021-10-15 DIAGNOSIS — M9903 Segmental and somatic dysfunction of lumbar region: Secondary | ICD-10-CM | POA: Diagnosis not present

## 2021-10-15 DIAGNOSIS — M9902 Segmental and somatic dysfunction of thoracic region: Secondary | ICD-10-CM | POA: Diagnosis not present

## 2021-10-15 DIAGNOSIS — M25551 Pain in right hip: Secondary | ICD-10-CM | POA: Diagnosis not present

## 2021-10-15 DIAGNOSIS — M5431 Sciatica, right side: Secondary | ICD-10-CM | POA: Diagnosis not present

## 2021-10-15 DIAGNOSIS — M9901 Segmental and somatic dysfunction of cervical region: Secondary | ICD-10-CM | POA: Diagnosis not present

## 2021-10-15 DIAGNOSIS — M542 Cervicalgia: Secondary | ICD-10-CM | POA: Diagnosis not present

## 2021-11-14 DIAGNOSIS — M542 Cervicalgia: Secondary | ICD-10-CM | POA: Diagnosis not present

## 2021-11-14 DIAGNOSIS — M9901 Segmental and somatic dysfunction of cervical region: Secondary | ICD-10-CM | POA: Diagnosis not present

## 2021-11-14 DIAGNOSIS — M5431 Sciatica, right side: Secondary | ICD-10-CM | POA: Diagnosis not present

## 2021-11-14 DIAGNOSIS — M25551 Pain in right hip: Secondary | ICD-10-CM | POA: Diagnosis not present

## 2021-11-14 DIAGNOSIS — M9903 Segmental and somatic dysfunction of lumbar region: Secondary | ICD-10-CM | POA: Diagnosis not present

## 2021-11-14 DIAGNOSIS — M9902 Segmental and somatic dysfunction of thoracic region: Secondary | ICD-10-CM | POA: Diagnosis not present

## 2021-11-22 DIAGNOSIS — Z23 Encounter for immunization: Secondary | ICD-10-CM | POA: Diagnosis not present

## 2021-11-22 DIAGNOSIS — Z905 Acquired absence of kidney: Secondary | ICD-10-CM | POA: Diagnosis not present

## 2021-11-22 DIAGNOSIS — R918 Other nonspecific abnormal finding of lung field: Secondary | ICD-10-CM | POA: Diagnosis not present

## 2021-11-22 DIAGNOSIS — C641 Malignant neoplasm of right kidney, except renal pelvis: Secondary | ICD-10-CM | POA: Diagnosis not present

## 2021-12-16 DIAGNOSIS — M9902 Segmental and somatic dysfunction of thoracic region: Secondary | ICD-10-CM | POA: Diagnosis not present

## 2021-12-16 DIAGNOSIS — M5431 Sciatica, right side: Secondary | ICD-10-CM | POA: Diagnosis not present

## 2021-12-16 DIAGNOSIS — M25551 Pain in right hip: Secondary | ICD-10-CM | POA: Diagnosis not present

## 2021-12-16 DIAGNOSIS — M542 Cervicalgia: Secondary | ICD-10-CM | POA: Diagnosis not present

## 2021-12-16 DIAGNOSIS — M9901 Segmental and somatic dysfunction of cervical region: Secondary | ICD-10-CM | POA: Diagnosis not present

## 2021-12-16 DIAGNOSIS — M9903 Segmental and somatic dysfunction of lumbar region: Secondary | ICD-10-CM | POA: Diagnosis not present

## 2021-12-26 DIAGNOSIS — M9902 Segmental and somatic dysfunction of thoracic region: Secondary | ICD-10-CM | POA: Diagnosis not present

## 2021-12-26 DIAGNOSIS — M5431 Sciatica, right side: Secondary | ICD-10-CM | POA: Diagnosis not present

## 2021-12-26 DIAGNOSIS — M9901 Segmental and somatic dysfunction of cervical region: Secondary | ICD-10-CM | POA: Diagnosis not present

## 2021-12-26 DIAGNOSIS — M542 Cervicalgia: Secondary | ICD-10-CM | POA: Diagnosis not present

## 2021-12-26 DIAGNOSIS — M25551 Pain in right hip: Secondary | ICD-10-CM | POA: Diagnosis not present

## 2021-12-26 DIAGNOSIS — M9903 Segmental and somatic dysfunction of lumbar region: Secondary | ICD-10-CM | POA: Diagnosis not present

## 2022-01-23 DIAGNOSIS — M542 Cervicalgia: Secondary | ICD-10-CM | POA: Diagnosis not present

## 2022-01-23 DIAGNOSIS — M9902 Segmental and somatic dysfunction of thoracic region: Secondary | ICD-10-CM | POA: Diagnosis not present

## 2022-01-23 DIAGNOSIS — M5431 Sciatica, right side: Secondary | ICD-10-CM | POA: Diagnosis not present

## 2022-01-23 DIAGNOSIS — M9903 Segmental and somatic dysfunction of lumbar region: Secondary | ICD-10-CM | POA: Diagnosis not present

## 2022-01-23 DIAGNOSIS — M25551 Pain in right hip: Secondary | ICD-10-CM | POA: Diagnosis not present

## 2022-01-23 DIAGNOSIS — M9901 Segmental and somatic dysfunction of cervical region: Secondary | ICD-10-CM | POA: Diagnosis not present

## 2022-02-20 DIAGNOSIS — M9903 Segmental and somatic dysfunction of lumbar region: Secondary | ICD-10-CM | POA: Diagnosis not present

## 2022-02-20 DIAGNOSIS — M542 Cervicalgia: Secondary | ICD-10-CM | POA: Diagnosis not present

## 2022-02-20 DIAGNOSIS — M5431 Sciatica, right side: Secondary | ICD-10-CM | POA: Diagnosis not present

## 2022-02-20 DIAGNOSIS — M25551 Pain in right hip: Secondary | ICD-10-CM | POA: Diagnosis not present

## 2022-02-20 DIAGNOSIS — M9901 Segmental and somatic dysfunction of cervical region: Secondary | ICD-10-CM | POA: Diagnosis not present

## 2022-02-20 DIAGNOSIS — M9902 Segmental and somatic dysfunction of thoracic region: Secondary | ICD-10-CM | POA: Diagnosis not present

## 2022-03-20 DIAGNOSIS — M9903 Segmental and somatic dysfunction of lumbar region: Secondary | ICD-10-CM | POA: Diagnosis not present

## 2022-03-20 DIAGNOSIS — M25551 Pain in right hip: Secondary | ICD-10-CM | POA: Diagnosis not present

## 2022-03-20 DIAGNOSIS — M5431 Sciatica, right side: Secondary | ICD-10-CM | POA: Diagnosis not present

## 2022-03-20 DIAGNOSIS — M9901 Segmental and somatic dysfunction of cervical region: Secondary | ICD-10-CM | POA: Diagnosis not present

## 2022-03-20 DIAGNOSIS — M542 Cervicalgia: Secondary | ICD-10-CM | POA: Diagnosis not present

## 2022-03-20 DIAGNOSIS — M9902 Segmental and somatic dysfunction of thoracic region: Secondary | ICD-10-CM | POA: Diagnosis not present

## 2022-05-07 DIAGNOSIS — I10 Essential (primary) hypertension: Secondary | ICD-10-CM | POA: Diagnosis not present

## 2022-05-07 DIAGNOSIS — Z1331 Encounter for screening for depression: Secondary | ICD-10-CM | POA: Diagnosis not present

## 2022-05-07 DIAGNOSIS — L03311 Cellulitis of abdominal wall: Secondary | ICD-10-CM | POA: Diagnosis not present

## 2022-05-07 DIAGNOSIS — Z0000 Encounter for general adult medical examination without abnormal findings: Secondary | ICD-10-CM | POA: Diagnosis not present

## 2022-05-08 DIAGNOSIS — M5431 Sciatica, right side: Secondary | ICD-10-CM | POA: Diagnosis not present

## 2022-05-08 DIAGNOSIS — M9901 Segmental and somatic dysfunction of cervical region: Secondary | ICD-10-CM | POA: Diagnosis not present

## 2022-05-08 DIAGNOSIS — M25551 Pain in right hip: Secondary | ICD-10-CM | POA: Diagnosis not present

## 2022-05-08 DIAGNOSIS — M9902 Segmental and somatic dysfunction of thoracic region: Secondary | ICD-10-CM | POA: Diagnosis not present

## 2022-05-08 DIAGNOSIS — M542 Cervicalgia: Secondary | ICD-10-CM | POA: Diagnosis not present

## 2022-05-08 DIAGNOSIS — M9903 Segmental and somatic dysfunction of lumbar region: Secondary | ICD-10-CM | POA: Diagnosis not present

## 2022-05-22 DIAGNOSIS — M5431 Sciatica, right side: Secondary | ICD-10-CM | POA: Diagnosis not present

## 2022-05-22 DIAGNOSIS — M9901 Segmental and somatic dysfunction of cervical region: Secondary | ICD-10-CM | POA: Diagnosis not present

## 2022-05-22 DIAGNOSIS — M542 Cervicalgia: Secondary | ICD-10-CM | POA: Diagnosis not present

## 2022-05-22 DIAGNOSIS — M9902 Segmental and somatic dysfunction of thoracic region: Secondary | ICD-10-CM | POA: Diagnosis not present

## 2022-05-22 DIAGNOSIS — M25551 Pain in right hip: Secondary | ICD-10-CM | POA: Diagnosis not present

## 2022-05-22 DIAGNOSIS — M9903 Segmental and somatic dysfunction of lumbar region: Secondary | ICD-10-CM | POA: Diagnosis not present

## 2022-05-23 DIAGNOSIS — Z905 Acquired absence of kidney: Secondary | ICD-10-CM | POA: Diagnosis not present

## 2022-05-23 DIAGNOSIS — R911 Solitary pulmonary nodule: Secondary | ICD-10-CM | POA: Diagnosis not present

## 2022-05-23 DIAGNOSIS — Z9049 Acquired absence of other specified parts of digestive tract: Secondary | ICD-10-CM | POA: Diagnosis not present

## 2022-05-23 DIAGNOSIS — Z90710 Acquired absence of both cervix and uterus: Secondary | ICD-10-CM | POA: Diagnosis not present

## 2022-05-23 DIAGNOSIS — C641 Malignant neoplasm of right kidney, except renal pelvis: Secondary | ICD-10-CM | POA: Diagnosis not present

## 2022-05-23 DIAGNOSIS — R918 Other nonspecific abnormal finding of lung field: Secondary | ICD-10-CM | POA: Diagnosis not present

## 2022-06-11 DIAGNOSIS — K529 Noninfective gastroenteritis and colitis, unspecified: Secondary | ICD-10-CM | POA: Diagnosis not present

## 2022-06-11 DIAGNOSIS — E860 Dehydration: Secondary | ICD-10-CM | POA: Diagnosis not present

## 2022-06-11 DIAGNOSIS — R252 Cramp and spasm: Secondary | ICD-10-CM | POA: Diagnosis not present

## 2022-06-16 DIAGNOSIS — R911 Solitary pulmonary nodule: Secondary | ICD-10-CM | POA: Diagnosis not present

## 2022-06-16 DIAGNOSIS — R918 Other nonspecific abnormal finding of lung field: Secondary | ICD-10-CM | POA: Diagnosis not present

## 2022-06-16 DIAGNOSIS — Z01818 Encounter for other preprocedural examination: Secondary | ICD-10-CM | POA: Diagnosis not present

## 2022-06-30 DIAGNOSIS — M25551 Pain in right hip: Secondary | ICD-10-CM | POA: Diagnosis not present

## 2022-06-30 DIAGNOSIS — M9901 Segmental and somatic dysfunction of cervical region: Secondary | ICD-10-CM | POA: Diagnosis not present

## 2022-06-30 DIAGNOSIS — M9903 Segmental and somatic dysfunction of lumbar region: Secondary | ICD-10-CM | POA: Diagnosis not present

## 2022-06-30 DIAGNOSIS — M5431 Sciatica, right side: Secondary | ICD-10-CM | POA: Diagnosis not present

## 2022-06-30 DIAGNOSIS — M542 Cervicalgia: Secondary | ICD-10-CM | POA: Diagnosis not present

## 2022-06-30 DIAGNOSIS — M9902 Segmental and somatic dysfunction of thoracic region: Secondary | ICD-10-CM | POA: Diagnosis not present

## 2022-07-07 DIAGNOSIS — M9901 Segmental and somatic dysfunction of cervical region: Secondary | ICD-10-CM | POA: Diagnosis not present

## 2022-07-07 DIAGNOSIS — M542 Cervicalgia: Secondary | ICD-10-CM | POA: Diagnosis not present

## 2022-07-07 DIAGNOSIS — M9903 Segmental and somatic dysfunction of lumbar region: Secondary | ICD-10-CM | POA: Diagnosis not present

## 2022-07-07 DIAGNOSIS — M25551 Pain in right hip: Secondary | ICD-10-CM | POA: Diagnosis not present

## 2022-07-07 DIAGNOSIS — M5431 Sciatica, right side: Secondary | ICD-10-CM | POA: Diagnosis not present

## 2022-07-07 DIAGNOSIS — M9902 Segmental and somatic dysfunction of thoracic region: Secondary | ICD-10-CM | POA: Diagnosis not present

## 2022-07-28 DIAGNOSIS — M25551 Pain in right hip: Secondary | ICD-10-CM | POA: Diagnosis not present

## 2022-07-28 DIAGNOSIS — M9901 Segmental and somatic dysfunction of cervical region: Secondary | ICD-10-CM | POA: Diagnosis not present

## 2022-07-28 DIAGNOSIS — M5431 Sciatica, right side: Secondary | ICD-10-CM | POA: Diagnosis not present

## 2022-07-28 DIAGNOSIS — M542 Cervicalgia: Secondary | ICD-10-CM | POA: Diagnosis not present

## 2022-07-28 DIAGNOSIS — M9902 Segmental and somatic dysfunction of thoracic region: Secondary | ICD-10-CM | POA: Diagnosis not present

## 2022-07-28 DIAGNOSIS — M9903 Segmental and somatic dysfunction of lumbar region: Secondary | ICD-10-CM | POA: Diagnosis not present

## 2022-08-09 IMAGING — CT CT CHEST-ABD-PELV W/ CM
2 of 5 series · 12 of 36 positions shown, 14 images · IV contrast (agent unspecified)
Comparison: None.

CLINICAL DATA: Persistent fever since dental surgery 1 month ago.

EXAM:
CT CHEST, ABDOMEN, AND PELVIS WITH CONTRAST
TECHNIQUE: Multidetector CT imaging of the chest, abdomen and pelvis was
performed following the standard protocol during bolus
administration of intravenous contrast.

[Series 3: cap with 5mm st · axial · 0.77mm/px · z∈[+827,+1342]mm · 9 of 129 slices shown, 11 images]
[im 13/129  mediastinal]
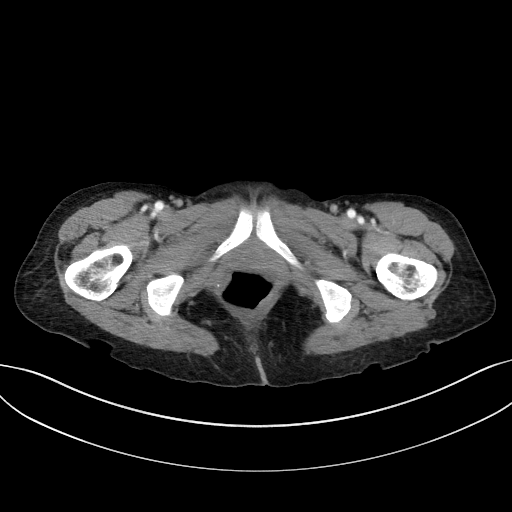
[im 13/129  bone]
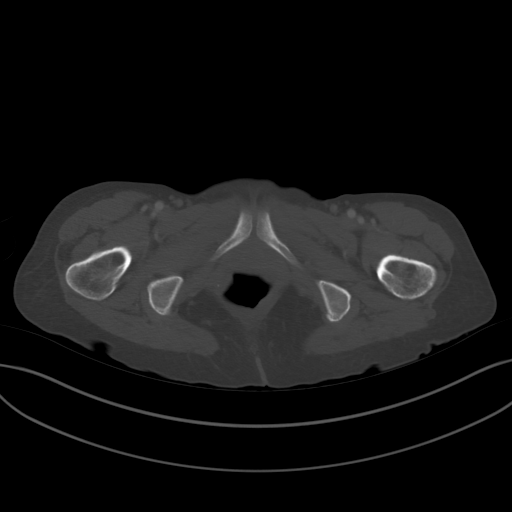
[im 26/129  mediastinal]
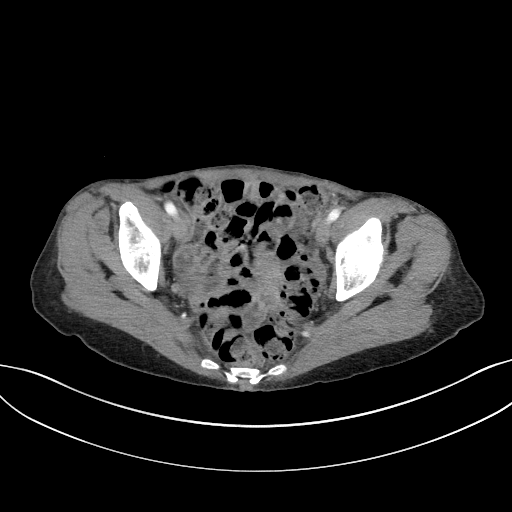
[im 39/129  mediastinal]
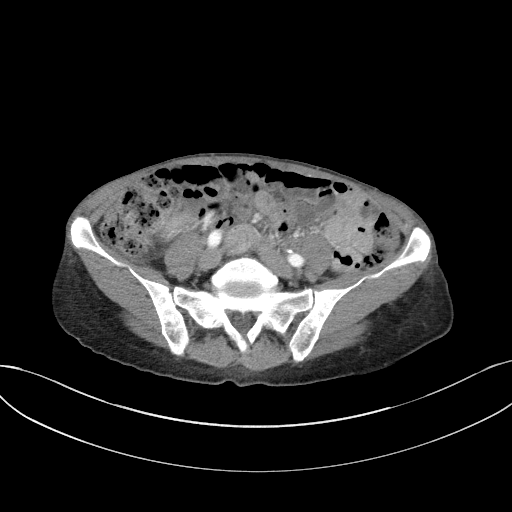
[im 52/129  mediastinal]
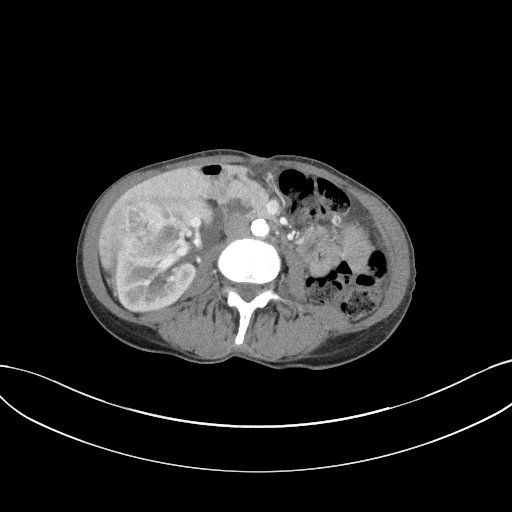
[im 65/129  mediastinal]
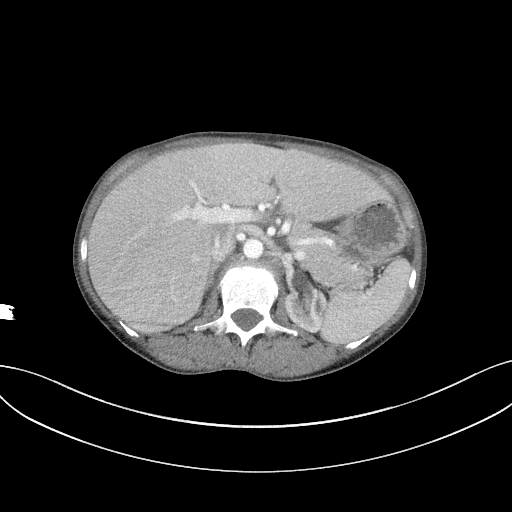
[im 77/129  mediastinal]
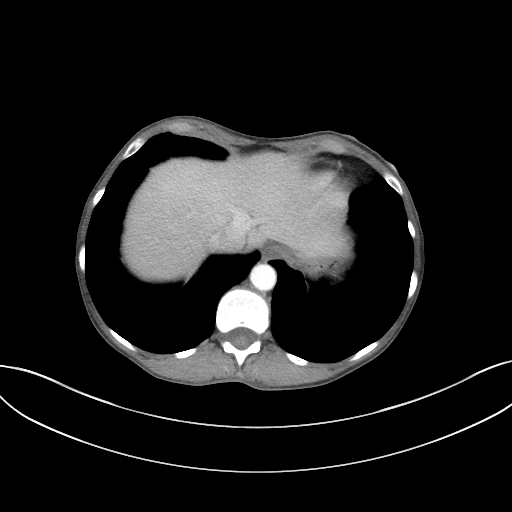
[im 90/129  mediastinal]
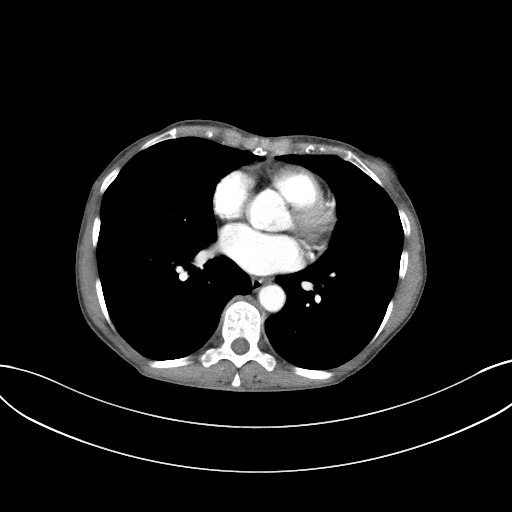
[im 103/129  mediastinal]
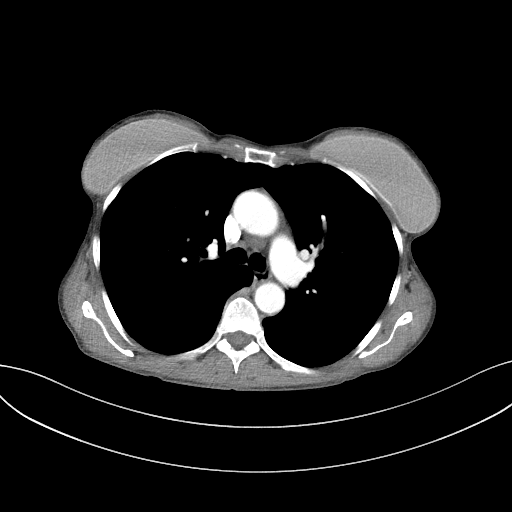
[im 116/129  mediastinal]
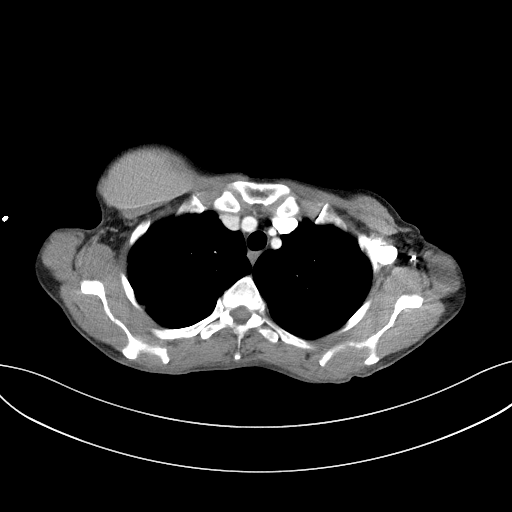
[im 116/129  bone]
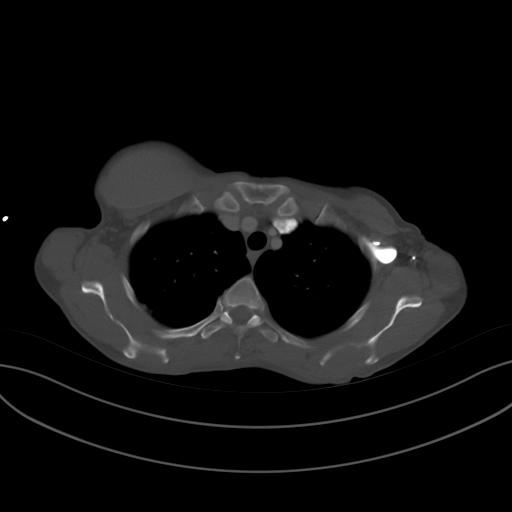

[Series 6: cap with 3mm st cor · coronal · 0.66mm/px · 3 of 107 slices shown]
[im 22/107  mediastinal]
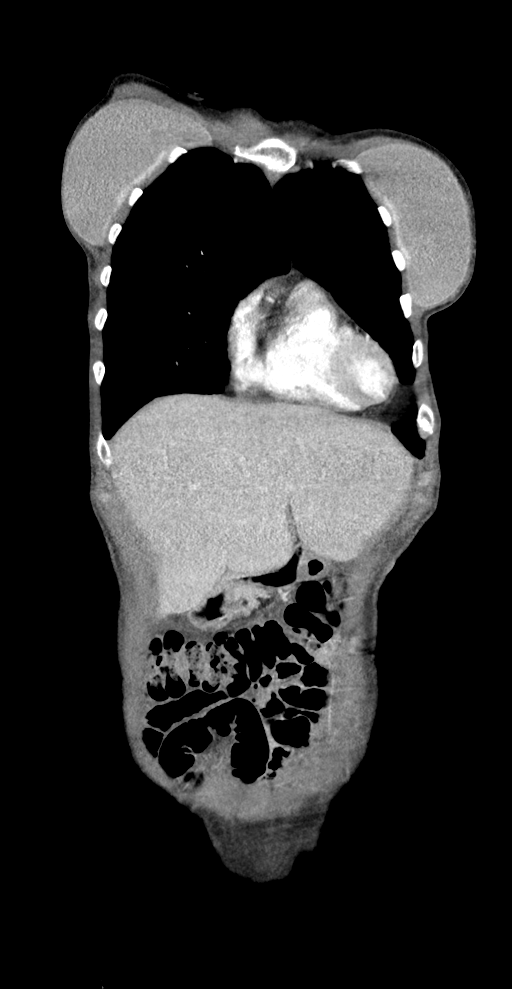
[im 43/107  mediastinal]
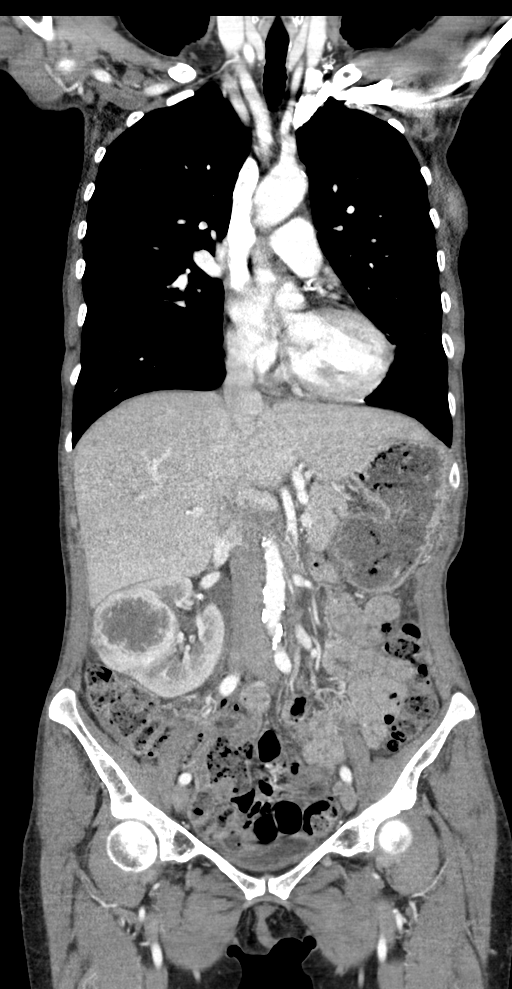
[im 64/107  mediastinal]
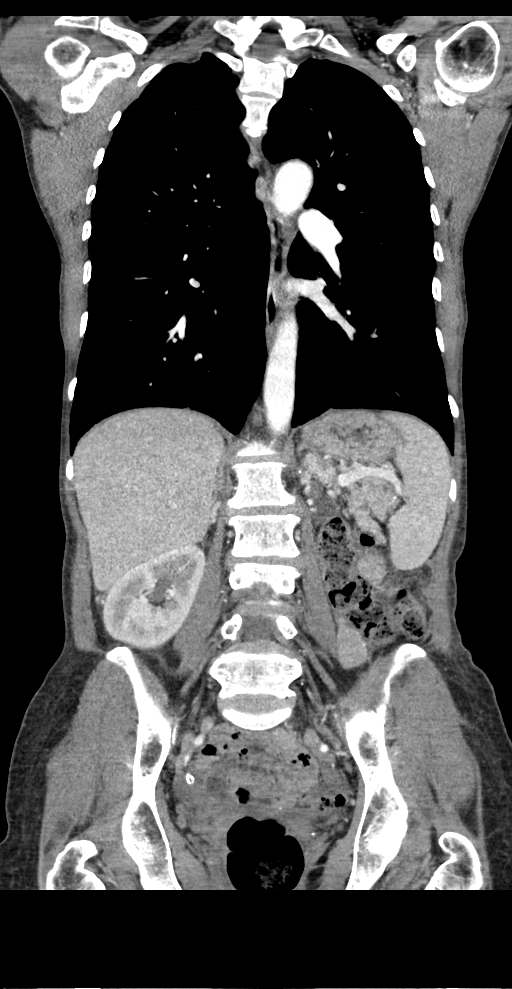

[12 of 36 positions shown; findings below may reference images not displayed]

RADIATION DOSE REDUCTION: This exam was performed according to the
departmental dose-optimization program which includes automated
exposure control, adjustment of the mA and/or kV according to
patient size and/or use of iterative reconstruction technique.

CONTRAST:  100mL OMNIPAQUE IOHEXOL 300 MG/ML  SOLN
FINDINGS: CT CHEST FINDINGS

Cardiovascular: The heart is normal in size. No pericardial
effusion. The aorta is normal in caliber. No dissection. Scattered
coronary artery calcifications.

Mediastinum/Nodes: No mediastinal or hilar mass or adenopathy. The
esophagus is grossly normal. The thyroid gland is unremarkable.

Lungs/Pleura: No acute pulmonary findings. No pulmonary infiltrates
pulmonary edema. There are mild emphysematous changes and mild
hyperinflation.

5 mm sub solid nodule is noted in the right upper lobe on image
number 49/5. This is an indeterminate finding but could represent an
area of inflammation No follow-up recommended. This recommendation
follows the consensus statement: Guidelines for Management of
Incidental Pulmonary Nodules Detected on CT Images: From the

No findings suspicious for pulmonary metastatic disease.

Musculoskeletal: No significant bony findings.

CT ABDOMEN PELVIS FINDINGS

Hepatobiliary: No hepatic lesions or intrahepatic biliary
dilatation. The gallbladder is surgically absent. No common bile
duct dilatation.

Pancreas: No mass, inflammation or ductal dilatation.

Spleen: Normal size.  No focal lesions.

Adrenals/Urinary Tract: The adrenal glands are unremarkable.

Small/atrophic left kidney with cortical scarring changes and
several calcifications. The right kidney demonstrates compensatory
hypertrophy. There is a E partially necrotic enhancing right renal
mass which measures 6.2 x 5.6 x 5.3 cm and consistent with renal
cell carcinoma. The renal vein is patent. No retroperitoneal
lymphadenopathy.

Stomach/Bowel: The stomach, duodenum, small bowel and colon are
grossly normal. No acute inflammatory process, mass lesion or
obstructive findings.

Vascular/Lymphatic: Moderate atherosclerotic calcifications
involving the abdominal aorta and iliac arteries but no aneurysm or
dissection. The major venous structures are patent. No mesenteric or
retroperitoneal mass or adenopathy.

Reproductive: The uterus is surgically absent. I believe both
ovaries are still present and unremarkable.

Other: No pelvic mass or adenopathy. No free pelvic fluid
collections. No inguinal mass or adenopathy. No abdominal wall
hernia or subcutaneous lesions.

Musculoskeletal: No significant bony findings. No findings
suspicious for osseous metastatic disease. Bilateral pars defects
are noted at L5. Degenerative anterolisthesis of L4.
IMPRESSION: 1. 6.2 x 5.6 x 5.3 cm partially necrotic enhancing right renal mass
consistent with renal cell carcinoma. No evidence of metastatic
disease. Recommend urology consultation.
2. Small/atrophic left kidney with cortical scarring changes and
several calcifications.
3. Status post cholecystectomy without biliary dilatation.
4. 5 mm sub solid nodule in the right upper lobe. This is an
indeterminate finding but could represent an area of inflammation.
No follow-up recommended.

Aortic Atherosclerosis ([id number]).

## 2022-08-10 IMAGING — CT CT MAXILLOFACIAL W/ CM
3 of 6 series · 16 of 47 positions shown, 19 images · IV contrast (APPLIED)
Comparison: None.

CLINICAL DATA: Recent dental work.  Rule out facial abscess.

EXAM:
CT MAXILLOFACIAL WITH CONTRAST
TECHNIQUE: Multidetector CT imaging of the maxillofacial structures was
performed with intravenous contrast. Multiplanar CT image
reconstructions were also generated.

[Series 3: maxilllofacial 2.0 hr40 3 · axial · 0.38mm/px · z∈[+1358,+1476]mm · 11 of 69 slices shown, 14 images]
[im 5/69  brain]
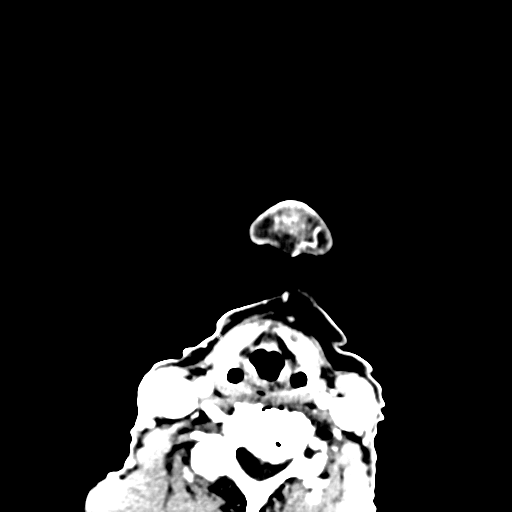
[im 5/69  bone]
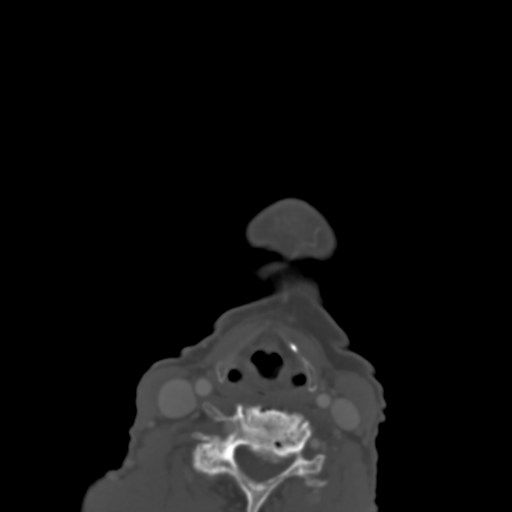
[im 10/69  bone]
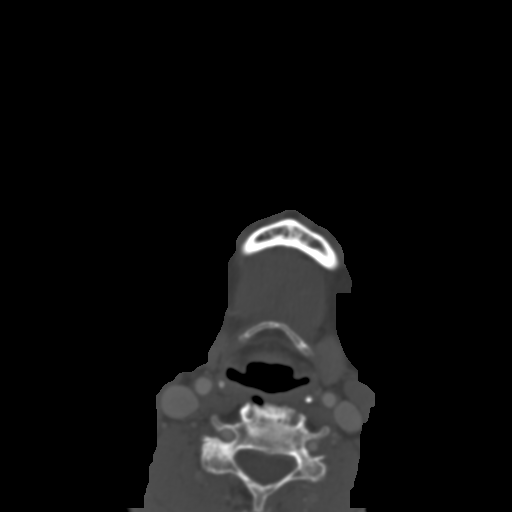
[im 15/69  bone]
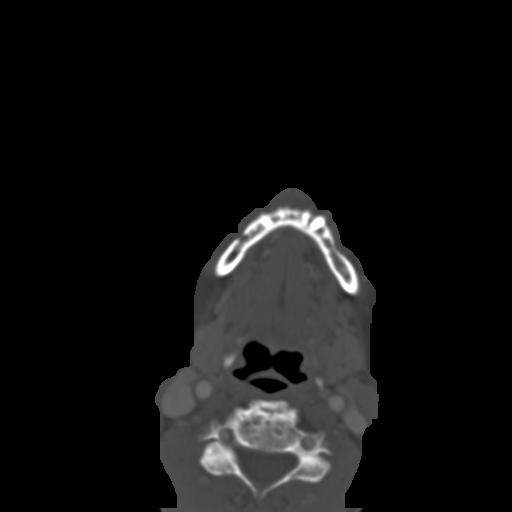
[im 25/69  bone]
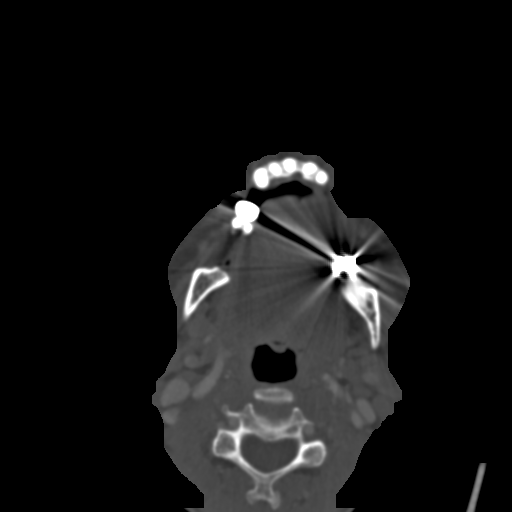
[im 30/69  brain]
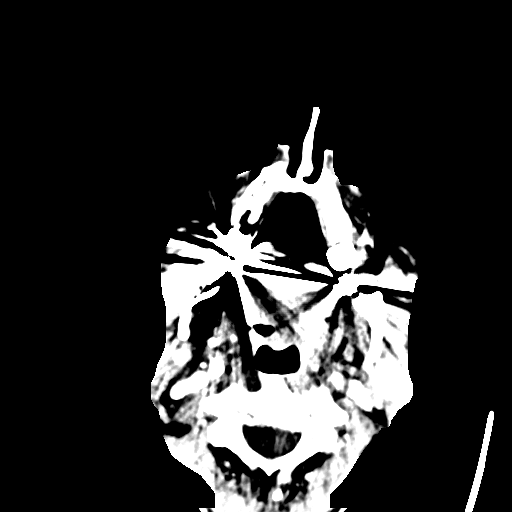
[im 30/69  bone]
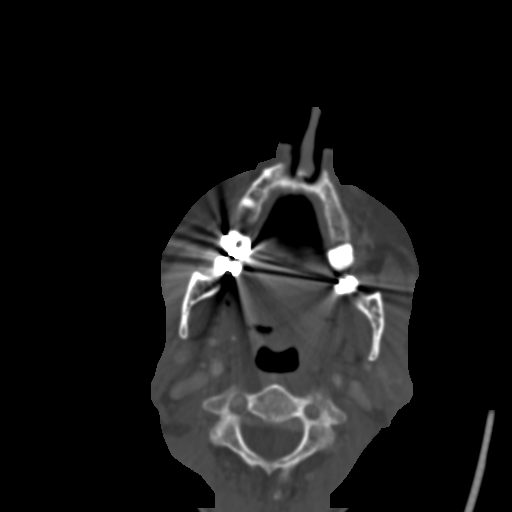
[im 35/69  bone]
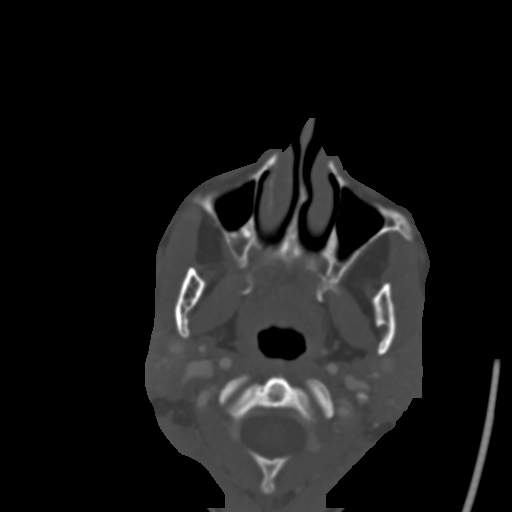
[im 39/69  bone]
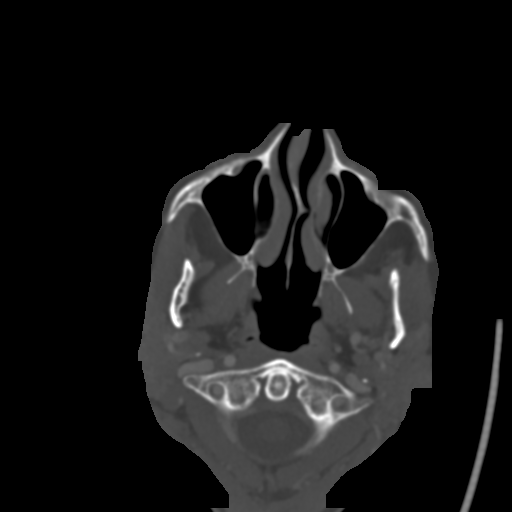
[im 44/69  bone]
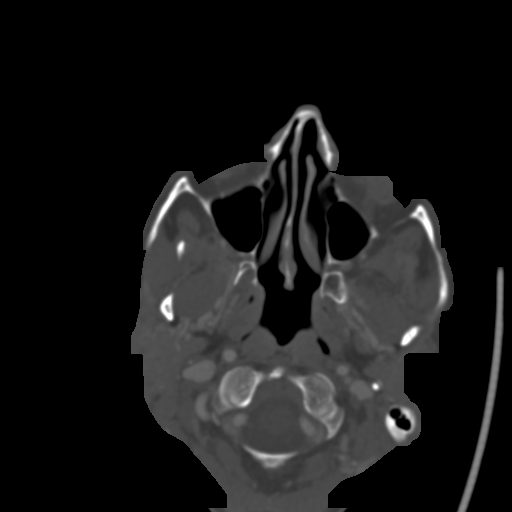
[im 54/69  brain]
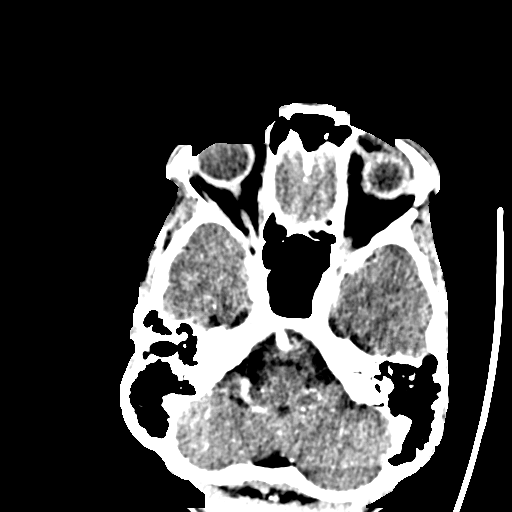
[im 54/69  bone]
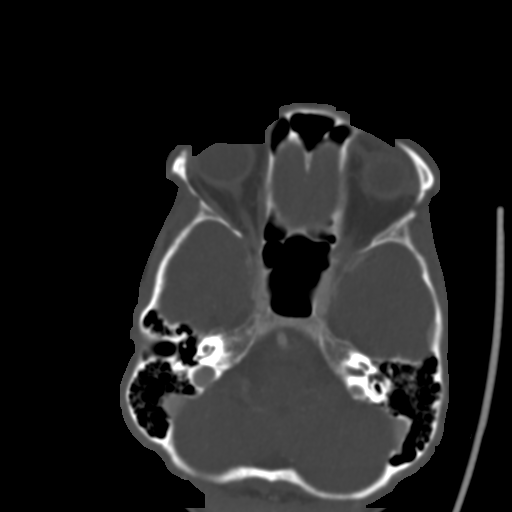
[im 59/69  bone]
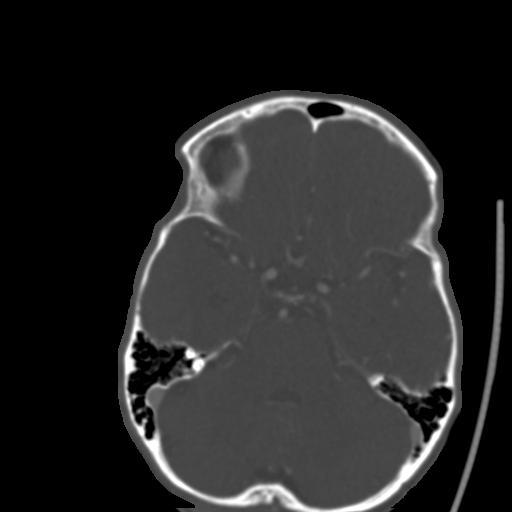
[im 64/69  bone]
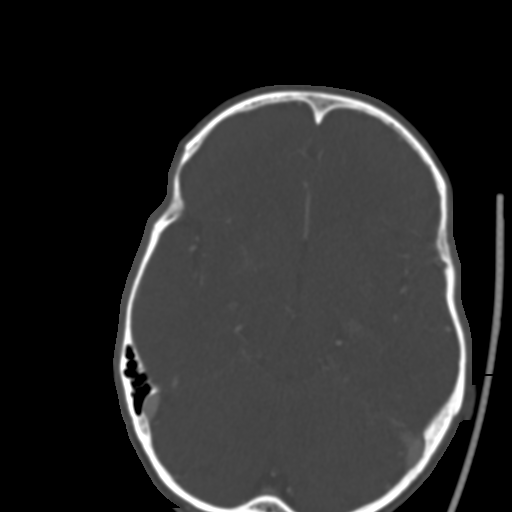

[Series 9: bone cor · coronal · 0.32mm/px · 3 of 99 slices shown]
[im 25/99  bone]
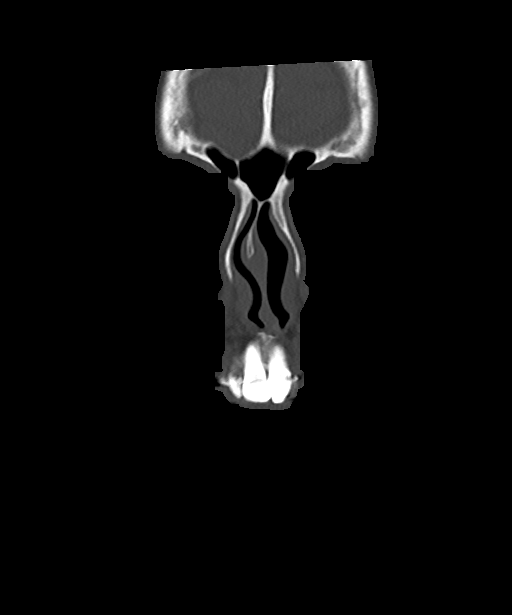
[im 50/99  bone]
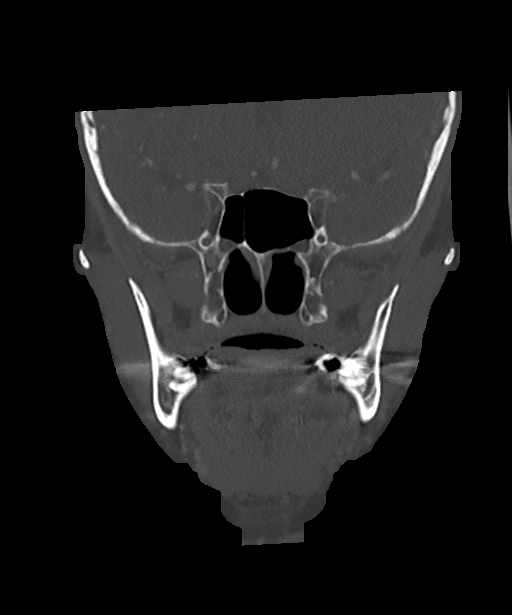
[im 74/99  bone]
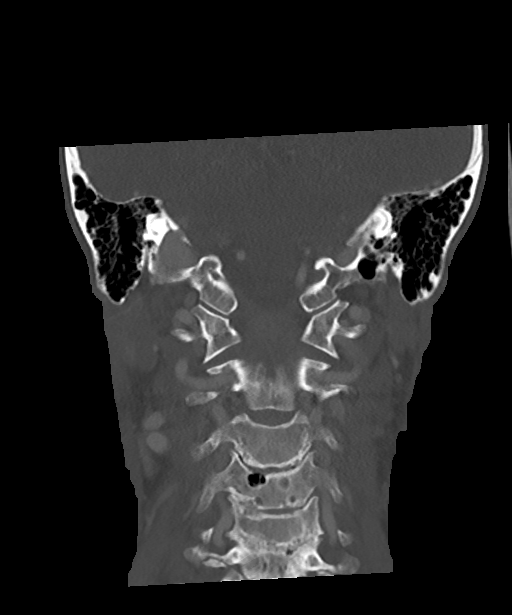

[Series 10: bone sag · sagittal · 0.35mm/px · 2 of 83 slices shown]
[im 28/83  bone]
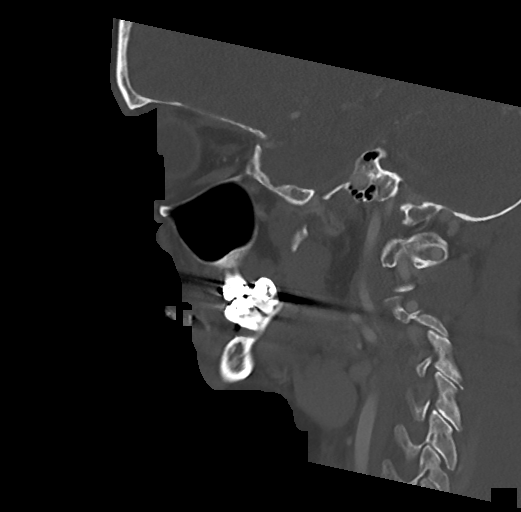
[im 55/83  bone]
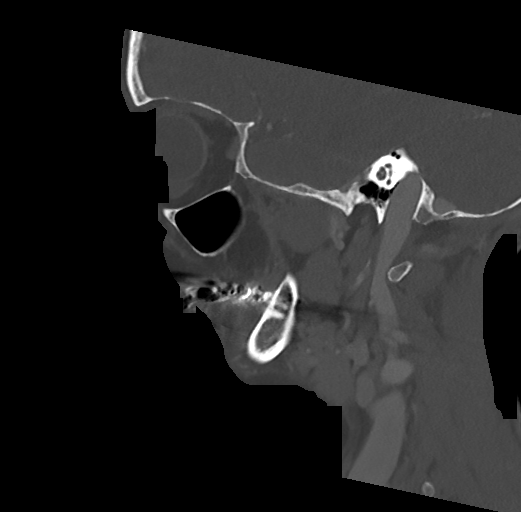

[16 of 47 positions shown; findings below may reference images not displayed]

RADIATION DOSE REDUCTION: This exam was performed according to the
departmental dose-optimization program which includes automated
exposure control, adjustment of the mA and/or kV according to
patient size and/or use of iterative reconstruction technique.

CONTRAST:  100mL OMNIPAQUE IOHEXOL 300 MG/ML  SOLN
FINDINGS: Osseous: Negative for fracture. No focal bone lesion. Advanced
cervical spondylosis.

Periapical lucency around left lower second molar.

Orbits: Negative for mass or edema.

Sinuses: Paranasal sinuses clear.  Mastoid clear bilaterally.

Soft tissues: Negative for facial abscess. No significant
cellulitis. No soft tissue mass. Negative pharynx.

Limited intracranial: Negative
IMPRESSION: Negative for facial abscess.  No significant cellulitis.

Periapical lucency around left lower second molar.

## 2022-08-14 DIAGNOSIS — H43813 Vitreous degeneration, bilateral: Secondary | ICD-10-CM | POA: Diagnosis not present

## 2022-08-14 DIAGNOSIS — H2513 Age-related nuclear cataract, bilateral: Secondary | ICD-10-CM | POA: Diagnosis not present

## 2022-08-14 DIAGNOSIS — H524 Presbyopia: Secondary | ICD-10-CM | POA: Diagnosis not present

## 2022-08-28 DIAGNOSIS — M9901 Segmental and somatic dysfunction of cervical region: Secondary | ICD-10-CM | POA: Diagnosis not present

## 2022-08-28 DIAGNOSIS — M5431 Sciatica, right side: Secondary | ICD-10-CM | POA: Diagnosis not present

## 2022-08-28 DIAGNOSIS — M9903 Segmental and somatic dysfunction of lumbar region: Secondary | ICD-10-CM | POA: Diagnosis not present

## 2022-08-28 DIAGNOSIS — M25551 Pain in right hip: Secondary | ICD-10-CM | POA: Diagnosis not present

## 2022-08-28 DIAGNOSIS — M542 Cervicalgia: Secondary | ICD-10-CM | POA: Diagnosis not present

## 2022-08-28 DIAGNOSIS — M9902 Segmental and somatic dysfunction of thoracic region: Secondary | ICD-10-CM | POA: Diagnosis not present

## 2022-09-23 DIAGNOSIS — I10 Essential (primary) hypertension: Secondary | ICD-10-CM | POA: Diagnosis not present

## 2022-09-23 DIAGNOSIS — K567 Ileus, unspecified: Secondary | ICD-10-CM | POA: Diagnosis not present

## 2022-09-24 DIAGNOSIS — K567 Ileus, unspecified: Secondary | ICD-10-CM | POA: Diagnosis not present

## 2022-10-04 DIAGNOSIS — Z931 Gastrostomy status: Secondary | ICD-10-CM | POA: Diagnosis not present

## 2022-10-04 DIAGNOSIS — R1031 Right lower quadrant pain: Secondary | ICD-10-CM | POA: Diagnosis not present

## 2022-10-04 DIAGNOSIS — Z90710 Acquired absence of both cervix and uterus: Secondary | ICD-10-CM | POA: Diagnosis not present

## 2022-10-04 DIAGNOSIS — C649 Malignant neoplasm of unspecified kidney, except renal pelvis: Secondary | ICD-10-CM | POA: Diagnosis not present

## 2022-10-04 DIAGNOSIS — D631 Anemia in chronic kidney disease: Secondary | ICD-10-CM | POA: Diagnosis not present

## 2022-10-04 DIAGNOSIS — R1110 Vomiting, unspecified: Secondary | ICD-10-CM | POA: Diagnosis not present

## 2022-10-04 DIAGNOSIS — Z9049 Acquired absence of other specified parts of digestive tract: Secondary | ICD-10-CM | POA: Diagnosis not present

## 2022-10-04 DIAGNOSIS — Z905 Acquired absence of kidney: Secondary | ICD-10-CM | POA: Diagnosis not present

## 2022-10-04 DIAGNOSIS — N1831 Chronic kidney disease, stage 3a: Secondary | ICD-10-CM | POA: Diagnosis not present

## 2022-10-04 DIAGNOSIS — Z79899 Other long term (current) drug therapy: Secondary | ICD-10-CM | POA: Diagnosis not present

## 2022-10-04 DIAGNOSIS — Z9851 Tubal ligation status: Secondary | ICD-10-CM | POA: Diagnosis not present

## 2022-10-04 DIAGNOSIS — K56609 Unspecified intestinal obstruction, unspecified as to partial versus complete obstruction: Secondary | ICD-10-CM | POA: Diagnosis not present

## 2022-10-04 DIAGNOSIS — R918 Other nonspecific abnormal finding of lung field: Secondary | ICD-10-CM | POA: Diagnosis not present

## 2022-10-04 DIAGNOSIS — K5730 Diverticulosis of large intestine without perforation or abscess without bleeding: Secondary | ICD-10-CM | POA: Diagnosis not present

## 2022-10-04 DIAGNOSIS — Z885 Allergy status to narcotic agent status: Secondary | ICD-10-CM | POA: Diagnosis not present

## 2022-10-04 DIAGNOSIS — Z4659 Encounter for fitting and adjustment of other gastrointestinal appliance and device: Secondary | ICD-10-CM | POA: Diagnosis not present

## 2022-10-04 DIAGNOSIS — I129 Hypertensive chronic kidney disease with stage 1 through stage 4 chronic kidney disease, or unspecified chronic kidney disease: Secondary | ICD-10-CM | POA: Diagnosis not present

## 2022-10-04 DIAGNOSIS — R911 Solitary pulmonary nodule: Secondary | ICD-10-CM | POA: Diagnosis not present

## 2022-10-04 DIAGNOSIS — E860 Dehydration: Secondary | ICD-10-CM | POA: Diagnosis not present

## 2022-10-04 DIAGNOSIS — Z882 Allergy status to sulfonamides status: Secondary | ICD-10-CM | POA: Diagnosis not present

## 2022-10-04 DIAGNOSIS — Z85528 Personal history of other malignant neoplasm of kidney: Secondary | ICD-10-CM | POA: Diagnosis not present

## 2022-10-04 DIAGNOSIS — R1084 Generalized abdominal pain: Secondary | ICD-10-CM | POA: Diagnosis not present

## 2022-10-04 DIAGNOSIS — I10 Essential (primary) hypertension: Secondary | ICD-10-CM | POA: Diagnosis not present

## 2022-10-04 DIAGNOSIS — Z4682 Encounter for fitting and adjustment of non-vascular catheter: Secondary | ICD-10-CM | POA: Diagnosis not present

## 2022-10-04 DIAGNOSIS — K565 Intestinal adhesions [bands], unspecified as to partial versus complete obstruction: Secondary | ICD-10-CM | POA: Diagnosis not present

## 2022-10-04 DIAGNOSIS — Z886 Allergy status to analgesic agent status: Secondary | ICD-10-CM | POA: Diagnosis not present

## 2022-10-04 DIAGNOSIS — D509 Iron deficiency anemia, unspecified: Secondary | ICD-10-CM | POA: Diagnosis not present

## 2022-10-06 DIAGNOSIS — Z85528 Personal history of other malignant neoplasm of kidney: Secondary | ICD-10-CM | POA: Diagnosis not present

## 2022-10-06 DIAGNOSIS — R1110 Vomiting, unspecified: Secondary | ICD-10-CM | POA: Diagnosis not present

## 2022-10-06 DIAGNOSIS — C649 Malignant neoplasm of unspecified kidney, except renal pelvis: Secondary | ICD-10-CM | POA: Diagnosis not present

## 2022-10-06 DIAGNOSIS — R636 Underweight: Secondary | ICD-10-CM | POA: Diagnosis not present

## 2022-10-06 DIAGNOSIS — Z905 Acquired absence of kidney: Secondary | ICD-10-CM | POA: Diagnosis not present

## 2022-10-06 DIAGNOSIS — I10 Essential (primary) hypertension: Secondary | ICD-10-CM | POA: Diagnosis not present

## 2022-10-06 DIAGNOSIS — K56609 Unspecified intestinal obstruction, unspecified as to partial versus complete obstruction: Secondary | ICD-10-CM | POA: Diagnosis not present

## 2022-10-06 DIAGNOSIS — K565 Intestinal adhesions [bands], unspecified as to partial versus complete obstruction: Secondary | ICD-10-CM | POA: Diagnosis not present

## 2022-10-06 DIAGNOSIS — Z888 Allergy status to other drugs, medicaments and biological substances status: Secondary | ICD-10-CM | POA: Diagnosis not present

## 2022-10-06 DIAGNOSIS — K5652 Intestinal adhesions [bands] with complete obstruction: Secondary | ICD-10-CM | POA: Diagnosis not present

## 2022-10-06 DIAGNOSIS — K460 Unspecified abdominal hernia with obstruction, without gangrene: Secondary | ICD-10-CM | POA: Diagnosis not present

## 2022-10-06 DIAGNOSIS — K56600 Partial intestinal obstruction, unspecified as to cause: Secondary | ICD-10-CM | POA: Diagnosis not present

## 2022-10-06 DIAGNOSIS — Z9049 Acquired absence of other specified parts of digestive tract: Secondary | ICD-10-CM | POA: Diagnosis not present

## 2022-10-06 DIAGNOSIS — N1831 Chronic kidney disease, stage 3a: Secondary | ICD-10-CM | POA: Diagnosis not present

## 2022-10-06 DIAGNOSIS — N390 Urinary tract infection, site not specified: Secondary | ICD-10-CM | POA: Diagnosis not present

## 2022-10-06 DIAGNOSIS — Z882 Allergy status to sulfonamides status: Secondary | ICD-10-CM | POA: Diagnosis not present

## 2022-10-06 DIAGNOSIS — R1031 Right lower quadrant pain: Secondary | ICD-10-CM | POA: Diagnosis not present

## 2022-10-06 DIAGNOSIS — D509 Iron deficiency anemia, unspecified: Secondary | ICD-10-CM | POA: Diagnosis not present

## 2022-10-06 DIAGNOSIS — Z79899 Other long term (current) drug therapy: Secondary | ICD-10-CM | POA: Diagnosis not present

## 2022-10-06 DIAGNOSIS — K458 Other specified abdominal hernia without obstruction or gangrene: Secondary | ICD-10-CM | POA: Diagnosis not present

## 2022-10-06 DIAGNOSIS — Z885 Allergy status to narcotic agent status: Secondary | ICD-10-CM | POA: Diagnosis not present

## 2022-10-06 DIAGNOSIS — D631 Anemia in chronic kidney disease: Secondary | ICD-10-CM | POA: Diagnosis not present

## 2022-10-06 DIAGNOSIS — Z4659 Encounter for fitting and adjustment of other gastrointestinal appliance and device: Secondary | ICD-10-CM | POA: Diagnosis not present

## 2022-10-06 DIAGNOSIS — I129 Hypertensive chronic kidney disease with stage 1 through stage 4 chronic kidney disease, or unspecified chronic kidney disease: Secondary | ICD-10-CM | POA: Diagnosis not present

## 2022-10-06 DIAGNOSIS — Z90710 Acquired absence of both cervix and uterus: Secondary | ICD-10-CM | POA: Diagnosis not present

## 2022-10-06 DIAGNOSIS — Z5189 Encounter for other specified aftercare: Secondary | ICD-10-CM | POA: Diagnosis not present

## 2022-10-06 DIAGNOSIS — R918 Other nonspecific abnormal finding of lung field: Secondary | ICD-10-CM | POA: Diagnosis not present

## 2022-10-06 DIAGNOSIS — Z681 Body mass index (BMI) 19 or less, adult: Secondary | ICD-10-CM | POA: Diagnosis not present

## 2022-10-08 DIAGNOSIS — K56609 Unspecified intestinal obstruction, unspecified as to partial versus complete obstruction: Secondary | ICD-10-CM | POA: Diagnosis not present

## 2022-10-09 DIAGNOSIS — Z85528 Personal history of other malignant neoplasm of kidney: Secondary | ICD-10-CM | POA: Diagnosis not present

## 2022-10-09 DIAGNOSIS — I10 Essential (primary) hypertension: Secondary | ICD-10-CM | POA: Diagnosis not present

## 2022-10-09 DIAGNOSIS — Z4659 Encounter for fitting and adjustment of other gastrointestinal appliance and device: Secondary | ICD-10-CM | POA: Diagnosis not present

## 2022-10-09 DIAGNOSIS — K56600 Partial intestinal obstruction, unspecified as to cause: Secondary | ICD-10-CM | POA: Diagnosis not present

## 2022-10-09 DIAGNOSIS — Z905 Acquired absence of kidney: Secondary | ICD-10-CM | POA: Diagnosis not present

## 2022-10-09 DIAGNOSIS — Z9049 Acquired absence of other specified parts of digestive tract: Secondary | ICD-10-CM | POA: Diagnosis not present

## 2022-10-09 DIAGNOSIS — Z90710 Acquired absence of both cervix and uterus: Secondary | ICD-10-CM | POA: Diagnosis not present

## 2022-10-09 DIAGNOSIS — K458 Other specified abdominal hernia without obstruction or gangrene: Secondary | ICD-10-CM | POA: Diagnosis not present

## 2022-10-09 DIAGNOSIS — K5652 Intestinal adhesions [bands] with complete obstruction: Secondary | ICD-10-CM | POA: Diagnosis not present

## 2022-10-09 DIAGNOSIS — K56609 Unspecified intestinal obstruction, unspecified as to partial versus complete obstruction: Secondary | ICD-10-CM | POA: Diagnosis not present

## 2022-10-22 DIAGNOSIS — N1831 Chronic kidney disease, stage 3a: Secondary | ICD-10-CM | POA: Diagnosis not present

## 2022-10-22 DIAGNOSIS — K567 Ileus, unspecified: Secondary | ICD-10-CM | POA: Diagnosis not present

## 2022-10-22 DIAGNOSIS — I10 Essential (primary) hypertension: Secondary | ICD-10-CM | POA: Diagnosis not present

## 2022-10-22 DIAGNOSIS — R1915 Other abnormal bowel sounds: Secondary | ICD-10-CM | POA: Diagnosis not present

## 2022-10-31 DIAGNOSIS — N1831 Chronic kidney disease, stage 3a: Secondary | ICD-10-CM | POA: Diagnosis not present

## 2022-10-31 DIAGNOSIS — R1915 Other abnormal bowel sounds: Secondary | ICD-10-CM | POA: Diagnosis not present

## 2022-10-31 DIAGNOSIS — M5431 Sciatica, right side: Secondary | ICD-10-CM | POA: Diagnosis not present

## 2022-10-31 DIAGNOSIS — M9902 Segmental and somatic dysfunction of thoracic region: Secondary | ICD-10-CM | POA: Diagnosis not present

## 2022-10-31 DIAGNOSIS — M9901 Segmental and somatic dysfunction of cervical region: Secondary | ICD-10-CM | POA: Diagnosis not present

## 2022-10-31 DIAGNOSIS — M25551 Pain in right hip: Secondary | ICD-10-CM | POA: Diagnosis not present

## 2022-10-31 DIAGNOSIS — M9903 Segmental and somatic dysfunction of lumbar region: Secondary | ICD-10-CM | POA: Diagnosis not present

## 2022-10-31 DIAGNOSIS — K567 Ileus, unspecified: Secondary | ICD-10-CM | POA: Diagnosis not present

## 2022-10-31 DIAGNOSIS — M542 Cervicalgia: Secondary | ICD-10-CM | POA: Diagnosis not present

## 2022-10-31 DIAGNOSIS — I10 Essential (primary) hypertension: Secondary | ICD-10-CM | POA: Diagnosis not present

## 2022-11-04 DIAGNOSIS — M5431 Sciatica, right side: Secondary | ICD-10-CM | POA: Diagnosis not present

## 2022-11-04 DIAGNOSIS — M9903 Segmental and somatic dysfunction of lumbar region: Secondary | ICD-10-CM | POA: Diagnosis not present

## 2022-11-04 DIAGNOSIS — M25551 Pain in right hip: Secondary | ICD-10-CM | POA: Diagnosis not present

## 2022-11-04 DIAGNOSIS — M542 Cervicalgia: Secondary | ICD-10-CM | POA: Diagnosis not present

## 2022-11-04 DIAGNOSIS — M9901 Segmental and somatic dysfunction of cervical region: Secondary | ICD-10-CM | POA: Diagnosis not present

## 2022-11-04 DIAGNOSIS — M9902 Segmental and somatic dysfunction of thoracic region: Secondary | ICD-10-CM | POA: Diagnosis not present

## 2022-11-10 DIAGNOSIS — K56609 Unspecified intestinal obstruction, unspecified as to partial versus complete obstruction: Secondary | ICD-10-CM | POA: Diagnosis not present

## 2022-11-10 DIAGNOSIS — Z5189 Encounter for other specified aftercare: Secondary | ICD-10-CM | POA: Diagnosis not present

## 2022-11-12 DIAGNOSIS — M9901 Segmental and somatic dysfunction of cervical region: Secondary | ICD-10-CM | POA: Diagnosis not present

## 2022-11-12 DIAGNOSIS — M5431 Sciatica, right side: Secondary | ICD-10-CM | POA: Diagnosis not present

## 2022-11-12 DIAGNOSIS — C641 Malignant neoplasm of right kidney, except renal pelvis: Secondary | ICD-10-CM | POA: Diagnosis not present

## 2022-11-12 DIAGNOSIS — R911 Solitary pulmonary nodule: Secondary | ICD-10-CM | POA: Diagnosis not present

## 2022-11-12 DIAGNOSIS — Z9049 Acquired absence of other specified parts of digestive tract: Secondary | ICD-10-CM | POA: Diagnosis not present

## 2022-11-12 DIAGNOSIS — D7389 Other diseases of spleen: Secondary | ICD-10-CM | POA: Diagnosis not present

## 2022-11-12 DIAGNOSIS — M9903 Segmental and somatic dysfunction of lumbar region: Secondary | ICD-10-CM | POA: Diagnosis not present

## 2022-11-12 DIAGNOSIS — Z85528 Personal history of other malignant neoplasm of kidney: Secondary | ICD-10-CM | POA: Diagnosis not present

## 2022-11-12 DIAGNOSIS — Z08 Encounter for follow-up examination after completed treatment for malignant neoplasm: Secondary | ICD-10-CM | POA: Diagnosis not present

## 2022-11-12 DIAGNOSIS — M9902 Segmental and somatic dysfunction of thoracic region: Secondary | ICD-10-CM | POA: Diagnosis not present

## 2022-11-12 DIAGNOSIS — Z905 Acquired absence of kidney: Secondary | ICD-10-CM | POA: Diagnosis not present

## 2022-11-12 DIAGNOSIS — M25551 Pain in right hip: Secondary | ICD-10-CM | POA: Diagnosis not present

## 2022-11-12 DIAGNOSIS — M542 Cervicalgia: Secondary | ICD-10-CM | POA: Diagnosis not present

## 2022-11-24 DIAGNOSIS — Z48815 Encounter for surgical aftercare following surgery on the digestive system: Secondary | ICD-10-CM | POA: Diagnosis not present

## 2022-11-24 DIAGNOSIS — K56609 Unspecified intestinal obstruction, unspecified as to partial versus complete obstruction: Secondary | ICD-10-CM | POA: Diagnosis not present

## 2022-12-12 DIAGNOSIS — Z1231 Encounter for screening mammogram for malignant neoplasm of breast: Secondary | ICD-10-CM | POA: Diagnosis not present

## 2022-12-17 DIAGNOSIS — M810 Age-related osteoporosis without current pathological fracture: Secondary | ICD-10-CM | POA: Diagnosis not present

## 2022-12-17 DIAGNOSIS — Z780 Asymptomatic menopausal state: Secondary | ICD-10-CM | POA: Diagnosis not present

## 2022-12-30 DIAGNOSIS — T8189XA Other complications of procedures, not elsewhere classified, initial encounter: Secondary | ICD-10-CM | POA: Diagnosis not present

## 2023-01-07 DIAGNOSIS — M25551 Pain in right hip: Secondary | ICD-10-CM | POA: Diagnosis not present

## 2023-01-07 DIAGNOSIS — M9901 Segmental and somatic dysfunction of cervical region: Secondary | ICD-10-CM | POA: Diagnosis not present

## 2023-01-07 DIAGNOSIS — M542 Cervicalgia: Secondary | ICD-10-CM | POA: Diagnosis not present

## 2023-01-07 DIAGNOSIS — M9902 Segmental and somatic dysfunction of thoracic region: Secondary | ICD-10-CM | POA: Diagnosis not present

## 2023-01-07 DIAGNOSIS — M9903 Segmental and somatic dysfunction of lumbar region: Secondary | ICD-10-CM | POA: Diagnosis not present

## 2023-01-07 DIAGNOSIS — M5431 Sciatica, right side: Secondary | ICD-10-CM | POA: Diagnosis not present

## 2023-01-15 DIAGNOSIS — M9902 Segmental and somatic dysfunction of thoracic region: Secondary | ICD-10-CM | POA: Diagnosis not present

## 2023-01-15 DIAGNOSIS — M542 Cervicalgia: Secondary | ICD-10-CM | POA: Diagnosis not present

## 2023-01-15 DIAGNOSIS — M25551 Pain in right hip: Secondary | ICD-10-CM | POA: Diagnosis not present

## 2023-01-15 DIAGNOSIS — M9903 Segmental and somatic dysfunction of lumbar region: Secondary | ICD-10-CM | POA: Diagnosis not present

## 2023-01-15 DIAGNOSIS — M9901 Segmental and somatic dysfunction of cervical region: Secondary | ICD-10-CM | POA: Diagnosis not present

## 2023-01-15 DIAGNOSIS — M5431 Sciatica, right side: Secondary | ICD-10-CM | POA: Diagnosis not present

## 2023-01-26 DIAGNOSIS — M9902 Segmental and somatic dysfunction of thoracic region: Secondary | ICD-10-CM | POA: Diagnosis not present

## 2023-01-26 DIAGNOSIS — M25551 Pain in right hip: Secondary | ICD-10-CM | POA: Diagnosis not present

## 2023-01-26 DIAGNOSIS — M9901 Segmental and somatic dysfunction of cervical region: Secondary | ICD-10-CM | POA: Diagnosis not present

## 2023-01-26 DIAGNOSIS — M5431 Sciatica, right side: Secondary | ICD-10-CM | POA: Diagnosis not present

## 2023-01-26 DIAGNOSIS — M9903 Segmental and somatic dysfunction of lumbar region: Secondary | ICD-10-CM | POA: Diagnosis not present

## 2023-01-26 DIAGNOSIS — M542 Cervicalgia: Secondary | ICD-10-CM | POA: Diagnosis not present

## 2023-02-05 DIAGNOSIS — M25551 Pain in right hip: Secondary | ICD-10-CM | POA: Diagnosis not present

## 2023-02-05 DIAGNOSIS — M9902 Segmental and somatic dysfunction of thoracic region: Secondary | ICD-10-CM | POA: Diagnosis not present

## 2023-02-05 DIAGNOSIS — M5431 Sciatica, right side: Secondary | ICD-10-CM | POA: Diagnosis not present

## 2023-02-05 DIAGNOSIS — M542 Cervicalgia: Secondary | ICD-10-CM | POA: Diagnosis not present

## 2023-02-05 DIAGNOSIS — M9901 Segmental and somatic dysfunction of cervical region: Secondary | ICD-10-CM | POA: Diagnosis not present

## 2023-02-05 DIAGNOSIS — M9903 Segmental and somatic dysfunction of lumbar region: Secondary | ICD-10-CM | POA: Diagnosis not present

## 2023-02-12 DIAGNOSIS — M9902 Segmental and somatic dysfunction of thoracic region: Secondary | ICD-10-CM | POA: Diagnosis not present

## 2023-02-12 DIAGNOSIS — M542 Cervicalgia: Secondary | ICD-10-CM | POA: Diagnosis not present

## 2023-02-12 DIAGNOSIS — M25551 Pain in right hip: Secondary | ICD-10-CM | POA: Diagnosis not present

## 2023-02-12 DIAGNOSIS — M9901 Segmental and somatic dysfunction of cervical region: Secondary | ICD-10-CM | POA: Diagnosis not present

## 2023-02-12 DIAGNOSIS — M9903 Segmental and somatic dysfunction of lumbar region: Secondary | ICD-10-CM | POA: Diagnosis not present

## 2023-02-12 DIAGNOSIS — M5431 Sciatica, right side: Secondary | ICD-10-CM | POA: Diagnosis not present

## 2023-02-19 DIAGNOSIS — M542 Cervicalgia: Secondary | ICD-10-CM | POA: Diagnosis not present

## 2023-02-19 DIAGNOSIS — R1915 Other abnormal bowel sounds: Secondary | ICD-10-CM | POA: Diagnosis not present

## 2023-02-19 DIAGNOSIS — M9903 Segmental and somatic dysfunction of lumbar region: Secondary | ICD-10-CM | POA: Diagnosis not present

## 2023-02-19 DIAGNOSIS — M810 Age-related osteoporosis without current pathological fracture: Secondary | ICD-10-CM | POA: Diagnosis not present

## 2023-02-19 DIAGNOSIS — N182 Chronic kidney disease, stage 2 (mild): Secondary | ICD-10-CM | POA: Diagnosis not present

## 2023-02-19 DIAGNOSIS — M9902 Segmental and somatic dysfunction of thoracic region: Secondary | ICD-10-CM | POA: Diagnosis not present

## 2023-02-19 DIAGNOSIS — M9901 Segmental and somatic dysfunction of cervical region: Secondary | ICD-10-CM | POA: Diagnosis not present

## 2023-02-19 DIAGNOSIS — M5431 Sciatica, right side: Secondary | ICD-10-CM | POA: Diagnosis not present

## 2023-02-19 DIAGNOSIS — M25551 Pain in right hip: Secondary | ICD-10-CM | POA: Diagnosis not present

## 2023-02-19 DIAGNOSIS — I10 Essential (primary) hypertension: Secondary | ICD-10-CM | POA: Diagnosis not present

## 2023-02-19 DIAGNOSIS — K567 Ileus, unspecified: Secondary | ICD-10-CM | POA: Diagnosis not present

## 2023-03-05 DIAGNOSIS — M9903 Segmental and somatic dysfunction of lumbar region: Secondary | ICD-10-CM | POA: Diagnosis not present

## 2023-03-05 DIAGNOSIS — M9902 Segmental and somatic dysfunction of thoracic region: Secondary | ICD-10-CM | POA: Diagnosis not present

## 2023-03-05 DIAGNOSIS — M5431 Sciatica, right side: Secondary | ICD-10-CM | POA: Diagnosis not present

## 2023-03-05 DIAGNOSIS — M9901 Segmental and somatic dysfunction of cervical region: Secondary | ICD-10-CM | POA: Diagnosis not present

## 2023-03-05 DIAGNOSIS — M25551 Pain in right hip: Secondary | ICD-10-CM | POA: Diagnosis not present

## 2023-03-05 DIAGNOSIS — M542 Cervicalgia: Secondary | ICD-10-CM | POA: Diagnosis not present

## 2023-03-26 DIAGNOSIS — M9902 Segmental and somatic dysfunction of thoracic region: Secondary | ICD-10-CM | POA: Diagnosis not present

## 2023-03-26 DIAGNOSIS — M9903 Segmental and somatic dysfunction of lumbar region: Secondary | ICD-10-CM | POA: Diagnosis not present

## 2023-03-26 DIAGNOSIS — M5431 Sciatica, right side: Secondary | ICD-10-CM | POA: Diagnosis not present

## 2023-03-26 DIAGNOSIS — M25551 Pain in right hip: Secondary | ICD-10-CM | POA: Diagnosis not present

## 2023-03-26 DIAGNOSIS — M9901 Segmental and somatic dysfunction of cervical region: Secondary | ICD-10-CM | POA: Diagnosis not present

## 2023-03-26 DIAGNOSIS — M542 Cervicalgia: Secondary | ICD-10-CM | POA: Diagnosis not present

## 2023-04-02 DIAGNOSIS — M9903 Segmental and somatic dysfunction of lumbar region: Secondary | ICD-10-CM | POA: Diagnosis not present

## 2023-04-02 DIAGNOSIS — M542 Cervicalgia: Secondary | ICD-10-CM | POA: Diagnosis not present

## 2023-04-02 DIAGNOSIS — M9902 Segmental and somatic dysfunction of thoracic region: Secondary | ICD-10-CM | POA: Diagnosis not present

## 2023-04-02 DIAGNOSIS — M25551 Pain in right hip: Secondary | ICD-10-CM | POA: Diagnosis not present

## 2023-04-02 DIAGNOSIS — M5431 Sciatica, right side: Secondary | ICD-10-CM | POA: Diagnosis not present

## 2023-04-02 DIAGNOSIS — M9901 Segmental and somatic dysfunction of cervical region: Secondary | ICD-10-CM | POA: Diagnosis not present

## 2023-04-16 DIAGNOSIS — M542 Cervicalgia: Secondary | ICD-10-CM | POA: Diagnosis not present

## 2023-04-16 DIAGNOSIS — M9901 Segmental and somatic dysfunction of cervical region: Secondary | ICD-10-CM | POA: Diagnosis not present

## 2023-04-16 DIAGNOSIS — M25551 Pain in right hip: Secondary | ICD-10-CM | POA: Diagnosis not present

## 2023-04-16 DIAGNOSIS — M5431 Sciatica, right side: Secondary | ICD-10-CM | POA: Diagnosis not present

## 2023-04-16 DIAGNOSIS — M9902 Segmental and somatic dysfunction of thoracic region: Secondary | ICD-10-CM | POA: Diagnosis not present

## 2023-04-16 DIAGNOSIS — M9903 Segmental and somatic dysfunction of lumbar region: Secondary | ICD-10-CM | POA: Diagnosis not present

## 2023-04-23 DIAGNOSIS — M9902 Segmental and somatic dysfunction of thoracic region: Secondary | ICD-10-CM | POA: Diagnosis not present

## 2023-04-23 DIAGNOSIS — M542 Cervicalgia: Secondary | ICD-10-CM | POA: Diagnosis not present

## 2023-04-23 DIAGNOSIS — M9901 Segmental and somatic dysfunction of cervical region: Secondary | ICD-10-CM | POA: Diagnosis not present

## 2023-04-23 DIAGNOSIS — M9903 Segmental and somatic dysfunction of lumbar region: Secondary | ICD-10-CM | POA: Diagnosis not present

## 2023-04-23 DIAGNOSIS — M25551 Pain in right hip: Secondary | ICD-10-CM | POA: Diagnosis not present

## 2023-04-23 DIAGNOSIS — M5431 Sciatica, right side: Secondary | ICD-10-CM | POA: Diagnosis not present

## 2023-05-07 DIAGNOSIS — M5431 Sciatica, right side: Secondary | ICD-10-CM | POA: Diagnosis not present

## 2023-05-07 DIAGNOSIS — M9903 Segmental and somatic dysfunction of lumbar region: Secondary | ICD-10-CM | POA: Diagnosis not present

## 2023-05-07 DIAGNOSIS — M9902 Segmental and somatic dysfunction of thoracic region: Secondary | ICD-10-CM | POA: Diagnosis not present

## 2023-05-07 DIAGNOSIS — M542 Cervicalgia: Secondary | ICD-10-CM | POA: Diagnosis not present

## 2023-05-07 DIAGNOSIS — M9901 Segmental and somatic dysfunction of cervical region: Secondary | ICD-10-CM | POA: Diagnosis not present

## 2023-05-07 DIAGNOSIS — M25551 Pain in right hip: Secondary | ICD-10-CM | POA: Diagnosis not present

## 2023-05-28 DIAGNOSIS — M9901 Segmental and somatic dysfunction of cervical region: Secondary | ICD-10-CM | POA: Diagnosis not present

## 2023-05-28 DIAGNOSIS — M5431 Sciatica, right side: Secondary | ICD-10-CM | POA: Diagnosis not present

## 2023-05-28 DIAGNOSIS — M9903 Segmental and somatic dysfunction of lumbar region: Secondary | ICD-10-CM | POA: Diagnosis not present

## 2023-05-28 DIAGNOSIS — M542 Cervicalgia: Secondary | ICD-10-CM | POA: Diagnosis not present

## 2023-05-28 DIAGNOSIS — M9902 Segmental and somatic dysfunction of thoracic region: Secondary | ICD-10-CM | POA: Diagnosis not present

## 2023-05-28 DIAGNOSIS — M25551 Pain in right hip: Secondary | ICD-10-CM | POA: Diagnosis not present

## 2023-06-11 DIAGNOSIS — M542 Cervicalgia: Secondary | ICD-10-CM | POA: Diagnosis not present

## 2023-06-11 DIAGNOSIS — M9902 Segmental and somatic dysfunction of thoracic region: Secondary | ICD-10-CM | POA: Diagnosis not present

## 2023-06-11 DIAGNOSIS — M25551 Pain in right hip: Secondary | ICD-10-CM | POA: Diagnosis not present

## 2023-06-11 DIAGNOSIS — M9901 Segmental and somatic dysfunction of cervical region: Secondary | ICD-10-CM | POA: Diagnosis not present

## 2023-06-11 DIAGNOSIS — M5431 Sciatica, right side: Secondary | ICD-10-CM | POA: Diagnosis not present

## 2023-06-11 DIAGNOSIS — M9903 Segmental and somatic dysfunction of lumbar region: Secondary | ICD-10-CM | POA: Diagnosis not present

## 2023-06-18 DIAGNOSIS — N182 Chronic kidney disease, stage 2 (mild): Secondary | ICD-10-CM | POA: Diagnosis not present

## 2023-06-18 DIAGNOSIS — Z79899 Other long term (current) drug therapy: Secondary | ICD-10-CM | POA: Diagnosis not present

## 2023-06-18 DIAGNOSIS — Z0000 Encounter for general adult medical examination without abnormal findings: Secondary | ICD-10-CM | POA: Diagnosis not present

## 2023-06-18 DIAGNOSIS — R1915 Other abnormal bowel sounds: Secondary | ICD-10-CM | POA: Diagnosis not present

## 2023-06-18 DIAGNOSIS — K567 Ileus, unspecified: Secondary | ICD-10-CM | POA: Diagnosis not present

## 2023-06-18 DIAGNOSIS — Z1331 Encounter for screening for depression: Secondary | ICD-10-CM | POA: Diagnosis not present

## 2023-06-18 DIAGNOSIS — R5383 Other fatigue: Secondary | ICD-10-CM | POA: Diagnosis not present

## 2023-06-18 DIAGNOSIS — I10 Essential (primary) hypertension: Secondary | ICD-10-CM | POA: Diagnosis not present

## 2023-07-02 DIAGNOSIS — M9902 Segmental and somatic dysfunction of thoracic region: Secondary | ICD-10-CM | POA: Diagnosis not present

## 2023-07-02 DIAGNOSIS — M25551 Pain in right hip: Secondary | ICD-10-CM | POA: Diagnosis not present

## 2023-07-02 DIAGNOSIS — M9901 Segmental and somatic dysfunction of cervical region: Secondary | ICD-10-CM | POA: Diagnosis not present

## 2023-07-02 DIAGNOSIS — M5431 Sciatica, right side: Secondary | ICD-10-CM | POA: Diagnosis not present

## 2023-07-02 DIAGNOSIS — M542 Cervicalgia: Secondary | ICD-10-CM | POA: Diagnosis not present

## 2023-07-02 DIAGNOSIS — M9903 Segmental and somatic dysfunction of lumbar region: Secondary | ICD-10-CM | POA: Diagnosis not present

## 2023-07-09 DIAGNOSIS — M542 Cervicalgia: Secondary | ICD-10-CM | POA: Diagnosis not present

## 2023-07-09 DIAGNOSIS — M9902 Segmental and somatic dysfunction of thoracic region: Secondary | ICD-10-CM | POA: Diagnosis not present

## 2023-07-09 DIAGNOSIS — M9903 Segmental and somatic dysfunction of lumbar region: Secondary | ICD-10-CM | POA: Diagnosis not present

## 2023-07-09 DIAGNOSIS — M25551 Pain in right hip: Secondary | ICD-10-CM | POA: Diagnosis not present

## 2023-07-09 DIAGNOSIS — M9901 Segmental and somatic dysfunction of cervical region: Secondary | ICD-10-CM | POA: Diagnosis not present

## 2023-07-09 DIAGNOSIS — M5431 Sciatica, right side: Secondary | ICD-10-CM | POA: Diagnosis not present

## 2023-07-23 DIAGNOSIS — M5431 Sciatica, right side: Secondary | ICD-10-CM | POA: Diagnosis not present

## 2023-07-23 DIAGNOSIS — M9903 Segmental and somatic dysfunction of lumbar region: Secondary | ICD-10-CM | POA: Diagnosis not present

## 2023-07-23 DIAGNOSIS — M542 Cervicalgia: Secondary | ICD-10-CM | POA: Diagnosis not present

## 2023-07-23 DIAGNOSIS — M9902 Segmental and somatic dysfunction of thoracic region: Secondary | ICD-10-CM | POA: Diagnosis not present

## 2023-07-23 DIAGNOSIS — M25551 Pain in right hip: Secondary | ICD-10-CM | POA: Diagnosis not present

## 2023-07-23 DIAGNOSIS — M9901 Segmental and somatic dysfunction of cervical region: Secondary | ICD-10-CM | POA: Diagnosis not present

## 2023-08-06 DIAGNOSIS — M9902 Segmental and somatic dysfunction of thoracic region: Secondary | ICD-10-CM | POA: Diagnosis not present

## 2023-08-06 DIAGNOSIS — M9903 Segmental and somatic dysfunction of lumbar region: Secondary | ICD-10-CM | POA: Diagnosis not present

## 2023-08-06 DIAGNOSIS — M9901 Segmental and somatic dysfunction of cervical region: Secondary | ICD-10-CM | POA: Diagnosis not present

## 2023-08-06 DIAGNOSIS — M542 Cervicalgia: Secondary | ICD-10-CM | POA: Diagnosis not present

## 2023-08-06 DIAGNOSIS — M5431 Sciatica, right side: Secondary | ICD-10-CM | POA: Diagnosis not present

## 2023-08-06 DIAGNOSIS — M25551 Pain in right hip: Secondary | ICD-10-CM | POA: Diagnosis not present

## 2023-08-14 DIAGNOSIS — H2513 Age-related nuclear cataract, bilateral: Secondary | ICD-10-CM | POA: Diagnosis not present

## 2023-08-14 DIAGNOSIS — R519 Headache, unspecified: Secondary | ICD-10-CM | POA: Diagnosis not present

## 2023-08-14 DIAGNOSIS — H43813 Vitreous degeneration, bilateral: Secondary | ICD-10-CM | POA: Diagnosis not present

## 2023-08-27 DIAGNOSIS — M9902 Segmental and somatic dysfunction of thoracic region: Secondary | ICD-10-CM | POA: Diagnosis not present

## 2023-08-27 DIAGNOSIS — M25551 Pain in right hip: Secondary | ICD-10-CM | POA: Diagnosis not present

## 2023-08-27 DIAGNOSIS — M542 Cervicalgia: Secondary | ICD-10-CM | POA: Diagnosis not present

## 2023-08-27 DIAGNOSIS — M5431 Sciatica, right side: Secondary | ICD-10-CM | POA: Diagnosis not present

## 2023-08-27 DIAGNOSIS — M9903 Segmental and somatic dysfunction of lumbar region: Secondary | ICD-10-CM | POA: Diagnosis not present

## 2023-08-27 DIAGNOSIS — M9901 Segmental and somatic dysfunction of cervical region: Secondary | ICD-10-CM | POA: Diagnosis not present

## 2023-09-17 DIAGNOSIS — M9901 Segmental and somatic dysfunction of cervical region: Secondary | ICD-10-CM | POA: Diagnosis not present

## 2023-09-17 DIAGNOSIS — M9902 Segmental and somatic dysfunction of thoracic region: Secondary | ICD-10-CM | POA: Diagnosis not present

## 2023-09-17 DIAGNOSIS — M9903 Segmental and somatic dysfunction of lumbar region: Secondary | ICD-10-CM | POA: Diagnosis not present

## 2023-09-17 DIAGNOSIS — M25551 Pain in right hip: Secondary | ICD-10-CM | POA: Diagnosis not present

## 2023-09-17 DIAGNOSIS — M542 Cervicalgia: Secondary | ICD-10-CM | POA: Diagnosis not present

## 2023-09-17 DIAGNOSIS — M5431 Sciatica, right side: Secondary | ICD-10-CM | POA: Diagnosis not present

## 2023-09-29 DIAGNOSIS — M9901 Segmental and somatic dysfunction of cervical region: Secondary | ICD-10-CM | POA: Diagnosis not present

## 2023-09-29 DIAGNOSIS — M542 Cervicalgia: Secondary | ICD-10-CM | POA: Diagnosis not present

## 2023-09-29 DIAGNOSIS — M9902 Segmental and somatic dysfunction of thoracic region: Secondary | ICD-10-CM | POA: Diagnosis not present

## 2023-09-29 DIAGNOSIS — M5431 Sciatica, right side: Secondary | ICD-10-CM | POA: Diagnosis not present

## 2023-09-29 DIAGNOSIS — M9903 Segmental and somatic dysfunction of lumbar region: Secondary | ICD-10-CM | POA: Diagnosis not present

## 2023-09-29 DIAGNOSIS — M25551 Pain in right hip: Secondary | ICD-10-CM | POA: Diagnosis not present

## 2023-10-08 DIAGNOSIS — M9901 Segmental and somatic dysfunction of cervical region: Secondary | ICD-10-CM | POA: Diagnosis not present

## 2023-10-08 DIAGNOSIS — M5431 Sciatica, right side: Secondary | ICD-10-CM | POA: Diagnosis not present

## 2023-10-08 DIAGNOSIS — M9903 Segmental and somatic dysfunction of lumbar region: Secondary | ICD-10-CM | POA: Diagnosis not present

## 2023-10-08 DIAGNOSIS — M25551 Pain in right hip: Secondary | ICD-10-CM | POA: Diagnosis not present

## 2023-10-08 DIAGNOSIS — M542 Cervicalgia: Secondary | ICD-10-CM | POA: Diagnosis not present

## 2023-10-08 DIAGNOSIS — M9902 Segmental and somatic dysfunction of thoracic region: Secondary | ICD-10-CM | POA: Diagnosis not present

## 2023-10-23 DIAGNOSIS — R911 Solitary pulmonary nodule: Secondary | ICD-10-CM | POA: Diagnosis not present

## 2023-10-23 DIAGNOSIS — C641 Malignant neoplasm of right kidney, except renal pelvis: Secondary | ICD-10-CM | POA: Diagnosis not present

## 2023-10-23 DIAGNOSIS — R918 Other nonspecific abnormal finding of lung field: Secondary | ICD-10-CM | POA: Diagnosis not present

## 2023-10-23 DIAGNOSIS — Z905 Acquired absence of kidney: Secondary | ICD-10-CM | POA: Diagnosis not present

## 2023-10-26 DIAGNOSIS — R911 Solitary pulmonary nodule: Secondary | ICD-10-CM | POA: Diagnosis not present

## 2023-10-28 DIAGNOSIS — M542 Cervicalgia: Secondary | ICD-10-CM | POA: Diagnosis not present

## 2023-10-28 DIAGNOSIS — M9903 Segmental and somatic dysfunction of lumbar region: Secondary | ICD-10-CM | POA: Diagnosis not present

## 2023-10-28 DIAGNOSIS — M25551 Pain in right hip: Secondary | ICD-10-CM | POA: Diagnosis not present

## 2023-10-28 DIAGNOSIS — M9901 Segmental and somatic dysfunction of cervical region: Secondary | ICD-10-CM | POA: Diagnosis not present

## 2023-10-28 DIAGNOSIS — M5431 Sciatica, right side: Secondary | ICD-10-CM | POA: Diagnosis not present

## 2023-10-28 DIAGNOSIS — M9902 Segmental and somatic dysfunction of thoracic region: Secondary | ICD-10-CM | POA: Diagnosis not present

## 2023-11-09 DIAGNOSIS — I10 Essential (primary) hypertension: Secondary | ICD-10-CM | POA: Diagnosis not present

## 2023-11-09 DIAGNOSIS — N182 Chronic kidney disease, stage 2 (mild): Secondary | ICD-10-CM | POA: Diagnosis not present

## 2023-11-12 DIAGNOSIS — M9902 Segmental and somatic dysfunction of thoracic region: Secondary | ICD-10-CM | POA: Diagnosis not present

## 2023-11-12 DIAGNOSIS — M9901 Segmental and somatic dysfunction of cervical region: Secondary | ICD-10-CM | POA: Diagnosis not present

## 2023-11-12 DIAGNOSIS — M25551 Pain in right hip: Secondary | ICD-10-CM | POA: Diagnosis not present

## 2023-11-12 DIAGNOSIS — M9903 Segmental and somatic dysfunction of lumbar region: Secondary | ICD-10-CM | POA: Diagnosis not present

## 2023-11-12 DIAGNOSIS — M5431 Sciatica, right side: Secondary | ICD-10-CM | POA: Diagnosis not present

## 2023-11-12 DIAGNOSIS — M542 Cervicalgia: Secondary | ICD-10-CM | POA: Diagnosis not present

## 2023-11-26 DIAGNOSIS — M9901 Segmental and somatic dysfunction of cervical region: Secondary | ICD-10-CM | POA: Diagnosis not present

## 2023-11-26 DIAGNOSIS — M5431 Sciatica, right side: Secondary | ICD-10-CM | POA: Diagnosis not present

## 2023-11-26 DIAGNOSIS — M25551 Pain in right hip: Secondary | ICD-10-CM | POA: Diagnosis not present

## 2023-11-26 DIAGNOSIS — M9903 Segmental and somatic dysfunction of lumbar region: Secondary | ICD-10-CM | POA: Diagnosis not present

## 2023-11-26 DIAGNOSIS — M9902 Segmental and somatic dysfunction of thoracic region: Secondary | ICD-10-CM | POA: Diagnosis not present

## 2023-11-26 DIAGNOSIS — M542 Cervicalgia: Secondary | ICD-10-CM | POA: Diagnosis not present

## 2023-11-27 DIAGNOSIS — N182 Chronic kidney disease, stage 2 (mild): Secondary | ICD-10-CM | POA: Diagnosis not present

## 2023-11-27 DIAGNOSIS — I10 Essential (primary) hypertension: Secondary | ICD-10-CM | POA: Diagnosis not present

## 2023-11-27 DIAGNOSIS — K567 Ileus, unspecified: Secondary | ICD-10-CM | POA: Diagnosis not present

## 2023-12-10 DIAGNOSIS — M5431 Sciatica, right side: Secondary | ICD-10-CM | POA: Diagnosis not present

## 2023-12-10 DIAGNOSIS — M542 Cervicalgia: Secondary | ICD-10-CM | POA: Diagnosis not present

## 2023-12-10 DIAGNOSIS — M25551 Pain in right hip: Secondary | ICD-10-CM | POA: Diagnosis not present

## 2023-12-10 DIAGNOSIS — M9901 Segmental and somatic dysfunction of cervical region: Secondary | ICD-10-CM | POA: Diagnosis not present

## 2023-12-10 DIAGNOSIS — M9902 Segmental and somatic dysfunction of thoracic region: Secondary | ICD-10-CM | POA: Diagnosis not present

## 2023-12-10 DIAGNOSIS — M9903 Segmental and somatic dysfunction of lumbar region: Secondary | ICD-10-CM | POA: Diagnosis not present

## 2023-12-11 DIAGNOSIS — N182 Chronic kidney disease, stage 2 (mild): Secondary | ICD-10-CM | POA: Diagnosis not present

## 2023-12-11 DIAGNOSIS — I10 Essential (primary) hypertension: Secondary | ICD-10-CM | POA: Diagnosis not present

## 2023-12-17 DIAGNOSIS — K567 Ileus, unspecified: Secondary | ICD-10-CM | POA: Diagnosis not present

## 2023-12-17 DIAGNOSIS — D4101 Neoplasm of uncertain behavior of right kidney: Secondary | ICD-10-CM | POA: Diagnosis not present

## 2023-12-17 DIAGNOSIS — E782 Mixed hyperlipidemia: Secondary | ICD-10-CM | POA: Diagnosis not present

## 2023-12-17 DIAGNOSIS — N1831 Chronic kidney disease, stage 3a: Secondary | ICD-10-CM | POA: Diagnosis not present

## 2023-12-17 DIAGNOSIS — M818 Other osteoporosis without current pathological fracture: Secondary | ICD-10-CM | POA: Diagnosis not present

## 2023-12-17 DIAGNOSIS — I10 Essential (primary) hypertension: Secondary | ICD-10-CM | POA: Diagnosis not present

## 2023-12-29 DIAGNOSIS — M9902 Segmental and somatic dysfunction of thoracic region: Secondary | ICD-10-CM | POA: Diagnosis not present

## 2023-12-29 DIAGNOSIS — M9901 Segmental and somatic dysfunction of cervical region: Secondary | ICD-10-CM | POA: Diagnosis not present

## 2023-12-29 DIAGNOSIS — M25551 Pain in right hip: Secondary | ICD-10-CM | POA: Diagnosis not present

## 2023-12-29 DIAGNOSIS — M5431 Sciatica, right side: Secondary | ICD-10-CM | POA: Diagnosis not present

## 2023-12-29 DIAGNOSIS — M542 Cervicalgia: Secondary | ICD-10-CM | POA: Diagnosis not present

## 2023-12-29 DIAGNOSIS — M9903 Segmental and somatic dysfunction of lumbar region: Secondary | ICD-10-CM | POA: Diagnosis not present

## 2024-01-07 DIAGNOSIS — M9901 Segmental and somatic dysfunction of cervical region: Secondary | ICD-10-CM | POA: Diagnosis not present

## 2024-01-07 DIAGNOSIS — M9902 Segmental and somatic dysfunction of thoracic region: Secondary | ICD-10-CM | POA: Diagnosis not present

## 2024-01-07 DIAGNOSIS — M25551 Pain in right hip: Secondary | ICD-10-CM | POA: Diagnosis not present

## 2024-01-07 DIAGNOSIS — M5431 Sciatica, right side: Secondary | ICD-10-CM | POA: Diagnosis not present

## 2024-01-07 DIAGNOSIS — M542 Cervicalgia: Secondary | ICD-10-CM | POA: Diagnosis not present

## 2024-01-07 DIAGNOSIS — M9903 Segmental and somatic dysfunction of lumbar region: Secondary | ICD-10-CM | POA: Diagnosis not present
